# Patient Record
Sex: Female | Born: 1945 | Race: White | Hispanic: No | Smoking: Current every day smoker
Health system: Southern US, Community
[De-identification: ages and names within clinical notes are randomized; demographics above are authoritative.]

## PROBLEM LIST (undated history)

## (undated) DIAGNOSIS — I441 Atrioventricular block, second degree: Secondary | ICD-10-CM

## (undated) DIAGNOSIS — I10 Essential (primary) hypertension: Secondary | ICD-10-CM

## (undated) DIAGNOSIS — Z950 Presence of cardiac pacemaker: Secondary | ICD-10-CM

## (undated) DIAGNOSIS — E119 Type 2 diabetes mellitus without complications: Secondary | ICD-10-CM

## (undated) DIAGNOSIS — T8859XA Other complications of anesthesia, initial encounter: Secondary | ICD-10-CM

## (undated) HISTORY — DX: Atrioventricular block, second degree: I44.1

## (undated) HISTORY — PX: CHOLECYSTECTOMY: SHX55

---

## 2011-12-08 DIAGNOSIS — I10 Essential (primary) hypertension: Secondary | ICD-10-CM | POA: Diagnosis not present

## 2011-12-08 DIAGNOSIS — E119 Type 2 diabetes mellitus without complications: Secondary | ICD-10-CM | POA: Diagnosis not present

## 2011-12-08 DIAGNOSIS — E782 Mixed hyperlipidemia: Secondary | ICD-10-CM | POA: Diagnosis not present

## 2012-04-06 DIAGNOSIS — I10 Essential (primary) hypertension: Secondary | ICD-10-CM | POA: Diagnosis not present

## 2012-04-06 DIAGNOSIS — E782 Mixed hyperlipidemia: Secondary | ICD-10-CM | POA: Diagnosis not present

## 2012-08-31 DIAGNOSIS — Z1331 Encounter for screening for depression: Secondary | ICD-10-CM | POA: Diagnosis not present

## 2012-08-31 DIAGNOSIS — E782 Mixed hyperlipidemia: Secondary | ICD-10-CM | POA: Diagnosis not present

## 2012-08-31 DIAGNOSIS — I10 Essential (primary) hypertension: Secondary | ICD-10-CM | POA: Diagnosis not present

## 2012-08-31 DIAGNOSIS — Z23 Encounter for immunization: Secondary | ICD-10-CM | POA: Diagnosis not present

## 2012-08-31 DIAGNOSIS — E119 Type 2 diabetes mellitus without complications: Secondary | ICD-10-CM | POA: Diagnosis not present

## 2012-12-27 DIAGNOSIS — E782 Mixed hyperlipidemia: Secondary | ICD-10-CM | POA: Diagnosis not present

## 2012-12-27 DIAGNOSIS — I10 Essential (primary) hypertension: Secondary | ICD-10-CM | POA: Diagnosis not present

## 2013-02-20 DIAGNOSIS — R079 Chest pain, unspecified: Secondary | ICD-10-CM | POA: Diagnosis not present

## 2013-02-20 DIAGNOSIS — E860 Dehydration: Secondary | ICD-10-CM | POA: Diagnosis not present

## 2013-02-20 DIAGNOSIS — E785 Hyperlipidemia, unspecified: Secondary | ICD-10-CM | POA: Diagnosis present

## 2013-02-20 DIAGNOSIS — K859 Acute pancreatitis without necrosis or infection, unspecified: Secondary | ICD-10-CM | POA: Diagnosis not present

## 2013-02-20 DIAGNOSIS — R0602 Shortness of breath: Secondary | ICD-10-CM | POA: Diagnosis not present

## 2013-02-20 DIAGNOSIS — I10 Essential (primary) hypertension: Secondary | ICD-10-CM | POA: Diagnosis present

## 2013-02-20 DIAGNOSIS — R6889 Other general symptoms and signs: Secondary | ICD-10-CM | POA: Diagnosis not present

## 2013-02-20 DIAGNOSIS — IMO0001 Reserved for inherently not codable concepts without codable children: Secondary | ICD-10-CM | POA: Diagnosis not present

## 2013-02-20 DIAGNOSIS — T466X5A Adverse effect of antihyperlipidemic and antiarteriosclerotic drugs, initial encounter: Secondary | ICD-10-CM | POA: Diagnosis not present

## 2013-02-20 DIAGNOSIS — E119 Type 2 diabetes mellitus without complications: Secondary | ICD-10-CM | POA: Diagnosis not present

## 2013-02-20 DIAGNOSIS — E872 Acidosis, unspecified: Secondary | ICD-10-CM | POA: Diagnosis present

## 2013-02-20 DIAGNOSIS — R110 Nausea: Secondary | ICD-10-CM | POA: Diagnosis not present

## 2013-02-20 DIAGNOSIS — N390 Urinary tract infection, site not specified: Secondary | ICD-10-CM | POA: Diagnosis not present

## 2013-02-20 DIAGNOSIS — K861 Other chronic pancreatitis: Secondary | ICD-10-CM | POA: Diagnosis not present

## 2013-02-20 DIAGNOSIS — F329 Major depressive disorder, single episode, unspecified: Secondary | ICD-10-CM | POA: Diagnosis present

## 2013-02-20 DIAGNOSIS — Z79899 Other long term (current) drug therapy: Secondary | ICD-10-CM | POA: Diagnosis not present

## 2013-02-20 DIAGNOSIS — T383X5A Adverse effect of insulin and oral hypoglycemic [antidiabetic] drugs, initial encounter: Secondary | ICD-10-CM | POA: Diagnosis not present

## 2013-02-20 DIAGNOSIS — Z87891 Personal history of nicotine dependence: Secondary | ICD-10-CM | POA: Diagnosis not present

## 2013-02-20 DIAGNOSIS — F411 Generalized anxiety disorder: Secondary | ICD-10-CM | POA: Diagnosis present

## 2013-02-20 DIAGNOSIS — Z7982 Long term (current) use of aspirin: Secondary | ICD-10-CM | POA: Diagnosis not present

## 2013-02-20 DIAGNOSIS — N179 Acute kidney failure, unspecified: Secondary | ICD-10-CM | POA: Diagnosis not present

## 2013-03-01 DIAGNOSIS — E872 Acidosis: Secondary | ICD-10-CM | POA: Diagnosis not present

## 2013-03-01 DIAGNOSIS — K859 Acute pancreatitis without necrosis or infection, unspecified: Secondary | ICD-10-CM | POA: Diagnosis not present

## 2013-03-09 DIAGNOSIS — E872 Acidosis: Secondary | ICD-10-CM | POA: Diagnosis not present

## 2013-03-27 DIAGNOSIS — E782 Mixed hyperlipidemia: Secondary | ICD-10-CM | POA: Diagnosis not present

## 2013-03-27 DIAGNOSIS — I10 Essential (primary) hypertension: Secondary | ICD-10-CM | POA: Diagnosis not present

## 2013-03-27 DIAGNOSIS — H60399 Other infective otitis externa, unspecified ear: Secondary | ICD-10-CM | POA: Diagnosis not present

## 2013-07-21 DIAGNOSIS — I10 Essential (primary) hypertension: Secondary | ICD-10-CM | POA: Diagnosis not present

## 2013-07-21 DIAGNOSIS — E782 Mixed hyperlipidemia: Secondary | ICD-10-CM | POA: Diagnosis not present

## 2013-07-28 DIAGNOSIS — E782 Mixed hyperlipidemia: Secondary | ICD-10-CM | POA: Diagnosis not present

## 2013-07-28 DIAGNOSIS — Z1389 Encounter for screening for other disorder: Secondary | ICD-10-CM | POA: Diagnosis not present

## 2013-07-28 DIAGNOSIS — I10 Essential (primary) hypertension: Secondary | ICD-10-CM | POA: Diagnosis not present

## 2013-07-31 DIAGNOSIS — I10 Essential (primary) hypertension: Secondary | ICD-10-CM | POA: Diagnosis not present

## 2013-07-31 DIAGNOSIS — H5200 Hypermetropia, unspecified eye: Secondary | ICD-10-CM | POA: Diagnosis not present

## 2013-07-31 DIAGNOSIS — H52229 Regular astigmatism, unspecified eye: Secondary | ICD-10-CM | POA: Diagnosis not present

## 2013-07-31 DIAGNOSIS — E119 Type 2 diabetes mellitus without complications: Secondary | ICD-10-CM | POA: Diagnosis not present

## 2013-08-23 DIAGNOSIS — Z23 Encounter for immunization: Secondary | ICD-10-CM | POA: Diagnosis not present

## 2014-01-01 DIAGNOSIS — I10 Essential (primary) hypertension: Secondary | ICD-10-CM | POA: Diagnosis not present

## 2014-01-01 DIAGNOSIS — E782 Mixed hyperlipidemia: Secondary | ICD-10-CM | POA: Diagnosis not present

## 2014-01-01 DIAGNOSIS — IMO0001 Reserved for inherently not codable concepts without codable children: Secondary | ICD-10-CM | POA: Diagnosis not present

## 2014-01-08 DIAGNOSIS — E782 Mixed hyperlipidemia: Secondary | ICD-10-CM | POA: Diagnosis not present

## 2014-01-08 DIAGNOSIS — Z1389 Encounter for screening for other disorder: Secondary | ICD-10-CM | POA: Diagnosis not present

## 2014-01-08 DIAGNOSIS — I10 Essential (primary) hypertension: Secondary | ICD-10-CM | POA: Diagnosis not present

## 2014-01-08 DIAGNOSIS — IMO0001 Reserved for inherently not codable concepts without codable children: Secondary | ICD-10-CM | POA: Diagnosis not present

## 2014-01-22 DIAGNOSIS — Z1389 Encounter for screening for other disorder: Secondary | ICD-10-CM | POA: Diagnosis not present

## 2014-01-22 DIAGNOSIS — Z1331 Encounter for screening for depression: Secondary | ICD-10-CM | POA: Diagnosis not present

## 2014-01-22 DIAGNOSIS — Z0000 Encounter for general adult medical examination without abnormal findings: Secondary | ICD-10-CM | POA: Diagnosis not present

## 2014-01-22 DIAGNOSIS — Z139 Encounter for screening, unspecified: Secondary | ICD-10-CM | POA: Diagnosis not present

## 2014-04-17 DIAGNOSIS — Z6837 Body mass index (BMI) 37.0-37.9, adult: Secondary | ICD-10-CM | POA: Diagnosis not present

## 2014-04-17 DIAGNOSIS — IMO0001 Reserved for inherently not codable concepts without codable children: Secondary | ICD-10-CM | POA: Diagnosis not present

## 2014-04-17 DIAGNOSIS — S8990XA Unspecified injury of unspecified lower leg, initial encounter: Secondary | ICD-10-CM | POA: Diagnosis not present

## 2014-04-17 DIAGNOSIS — S79919A Unspecified injury of unspecified hip, initial encounter: Secondary | ICD-10-CM | POA: Diagnosis not present

## 2014-04-17 DIAGNOSIS — S99919A Unspecified injury of unspecified ankle, initial encounter: Secondary | ICD-10-CM | POA: Diagnosis not present

## 2014-04-17 DIAGNOSIS — M79609 Pain in unspecified limb: Secondary | ICD-10-CM | POA: Diagnosis not present

## 2014-04-17 DIAGNOSIS — M25569 Pain in unspecified knee: Secondary | ICD-10-CM | POA: Diagnosis not present

## 2014-04-24 DIAGNOSIS — M25559 Pain in unspecified hip: Secondary | ICD-10-CM | POA: Diagnosis not present

## 2014-04-24 DIAGNOSIS — Z6837 Body mass index (BMI) 37.0-37.9, adult: Secondary | ICD-10-CM | POA: Diagnosis not present

## 2014-05-01 DIAGNOSIS — IMO0001 Reserved for inherently not codable concepts without codable children: Secondary | ICD-10-CM | POA: Diagnosis not present

## 2014-05-01 DIAGNOSIS — I10 Essential (primary) hypertension: Secondary | ICD-10-CM | POA: Diagnosis not present

## 2014-05-01 DIAGNOSIS — E782 Mixed hyperlipidemia: Secondary | ICD-10-CM | POA: Diagnosis not present

## 2014-05-08 DIAGNOSIS — G4700 Insomnia, unspecified: Secondary | ICD-10-CM | POA: Diagnosis not present

## 2014-05-08 DIAGNOSIS — IMO0001 Reserved for inherently not codable concepts without codable children: Secondary | ICD-10-CM | POA: Diagnosis not present

## 2014-05-08 DIAGNOSIS — I10 Essential (primary) hypertension: Secondary | ICD-10-CM | POA: Diagnosis not present

## 2014-05-08 DIAGNOSIS — E782 Mixed hyperlipidemia: Secondary | ICD-10-CM | POA: Diagnosis not present

## 2014-06-20 DIAGNOSIS — L259 Unspecified contact dermatitis, unspecified cause: Secondary | ICD-10-CM | POA: Diagnosis not present

## 2014-06-20 DIAGNOSIS — Z6837 Body mass index (BMI) 37.0-37.9, adult: Secondary | ICD-10-CM | POA: Diagnosis not present

## 2014-08-07 DIAGNOSIS — E782 Mixed hyperlipidemia: Secondary | ICD-10-CM | POA: Diagnosis not present

## 2014-08-07 DIAGNOSIS — I10 Essential (primary) hypertension: Secondary | ICD-10-CM | POA: Diagnosis not present

## 2014-08-07 DIAGNOSIS — IMO0001 Reserved for inherently not codable concepts without codable children: Secondary | ICD-10-CM | POA: Diagnosis not present

## 2014-08-29 DIAGNOSIS — Z23 Encounter for immunization: Secondary | ICD-10-CM | POA: Diagnosis not present

## 2014-08-29 DIAGNOSIS — E1169 Type 2 diabetes mellitus with other specified complication: Secondary | ICD-10-CM | POA: Diagnosis not present

## 2014-08-29 DIAGNOSIS — E0869 Diabetes mellitus due to underlying condition with other specified complication: Secondary | ICD-10-CM | POA: Diagnosis not present

## 2014-08-29 DIAGNOSIS — I10 Essential (primary) hypertension: Secondary | ICD-10-CM | POA: Diagnosis not present

## 2014-08-29 DIAGNOSIS — E1165 Type 2 diabetes mellitus with hyperglycemia: Secondary | ICD-10-CM | POA: Diagnosis not present

## 2014-12-06 DIAGNOSIS — E1165 Type 2 diabetes mellitus with hyperglycemia: Secondary | ICD-10-CM | POA: Diagnosis not present

## 2014-12-06 DIAGNOSIS — E1169 Type 2 diabetes mellitus with other specified complication: Secondary | ICD-10-CM | POA: Diagnosis not present

## 2014-12-06 DIAGNOSIS — E0869 Diabetes mellitus due to underlying condition with other specified complication: Secondary | ICD-10-CM | POA: Diagnosis not present

## 2015-04-18 DIAGNOSIS — E119 Type 2 diabetes mellitus without complications: Secondary | ICD-10-CM | POA: Diagnosis not present

## 2015-04-18 DIAGNOSIS — H35373 Puckering of macula, bilateral: Secondary | ICD-10-CM | POA: Diagnosis not present

## 2015-04-18 DIAGNOSIS — H524 Presbyopia: Secondary | ICD-10-CM | POA: Diagnosis not present

## 2015-04-18 DIAGNOSIS — H5203 Hypermetropia, bilateral: Secondary | ICD-10-CM | POA: Diagnosis not present

## 2015-04-18 DIAGNOSIS — H43313 Vitreous membranes and strands, bilateral: Secondary | ICD-10-CM | POA: Diagnosis not present

## 2015-04-18 DIAGNOSIS — H25813 Combined forms of age-related cataract, bilateral: Secondary | ICD-10-CM | POA: Diagnosis not present

## 2015-04-18 DIAGNOSIS — H35351 Cystoid macular degeneration, right eye: Secondary | ICD-10-CM | POA: Diagnosis not present

## 2015-04-18 DIAGNOSIS — H52223 Regular astigmatism, bilateral: Secondary | ICD-10-CM | POA: Diagnosis not present

## 2015-04-18 DIAGNOSIS — H43813 Vitreous degeneration, bilateral: Secondary | ICD-10-CM | POA: Diagnosis not present

## 2015-04-18 DIAGNOSIS — I10 Essential (primary) hypertension: Secondary | ICD-10-CM | POA: Diagnosis not present

## 2015-05-10 DIAGNOSIS — E119 Type 2 diabetes mellitus without complications: Secondary | ICD-10-CM | POA: Diagnosis not present

## 2015-05-10 DIAGNOSIS — I10 Essential (primary) hypertension: Secondary | ICD-10-CM | POA: Diagnosis not present

## 2015-05-10 DIAGNOSIS — R05 Cough: Secondary | ICD-10-CM | POA: Diagnosis not present

## 2015-05-10 DIAGNOSIS — Z79899 Other long term (current) drug therapy: Secondary | ICD-10-CM | POA: Diagnosis not present

## 2015-05-10 DIAGNOSIS — J209 Acute bronchitis, unspecified: Secondary | ICD-10-CM | POA: Diagnosis not present

## 2015-05-10 DIAGNOSIS — H6691 Otitis media, unspecified, right ear: Secondary | ICD-10-CM | POA: Diagnosis not present

## 2015-05-10 DIAGNOSIS — Z7982 Long term (current) use of aspirin: Secondary | ICD-10-CM | POA: Diagnosis not present

## 2015-05-10 DIAGNOSIS — R509 Fever, unspecified: Secondary | ICD-10-CM | POA: Diagnosis not present

## 2015-05-10 DIAGNOSIS — Z87891 Personal history of nicotine dependence: Secondary | ICD-10-CM | POA: Diagnosis not present

## 2015-05-13 DIAGNOSIS — J40 Bronchitis, not specified as acute or chronic: Secondary | ICD-10-CM | POA: Diagnosis not present

## 2015-05-13 DIAGNOSIS — Z6837 Body mass index (BMI) 37.0-37.9, adult: Secondary | ICD-10-CM | POA: Diagnosis not present

## 2015-07-03 DIAGNOSIS — E1369 Other specified diabetes mellitus with other specified complication: Secondary | ICD-10-CM | POA: Diagnosis not present

## 2015-07-03 DIAGNOSIS — E1159 Type 2 diabetes mellitus with other circulatory complications: Secondary | ICD-10-CM | POA: Diagnosis not present

## 2015-07-03 DIAGNOSIS — E1165 Type 2 diabetes mellitus with hyperglycemia: Secondary | ICD-10-CM | POA: Diagnosis not present

## 2015-07-10 DIAGNOSIS — E1159 Type 2 diabetes mellitus with other circulatory complications: Secondary | ICD-10-CM | POA: Diagnosis not present

## 2015-07-10 DIAGNOSIS — E1369 Other specified diabetes mellitus with other specified complication: Secondary | ICD-10-CM | POA: Diagnosis not present

## 2015-07-10 DIAGNOSIS — E1165 Type 2 diabetes mellitus with hyperglycemia: Secondary | ICD-10-CM | POA: Diagnosis not present

## 2015-07-10 DIAGNOSIS — E785 Hyperlipidemia, unspecified: Secondary | ICD-10-CM | POA: Diagnosis not present

## 2015-09-30 DIAGNOSIS — Z23 Encounter for immunization: Secondary | ICD-10-CM | POA: Diagnosis not present

## 2015-10-08 DIAGNOSIS — E1369 Other specified diabetes mellitus with other specified complication: Secondary | ICD-10-CM | POA: Diagnosis not present

## 2015-10-08 DIAGNOSIS — E1165 Type 2 diabetes mellitus with hyperglycemia: Secondary | ICD-10-CM | POA: Diagnosis not present

## 2015-10-14 DIAGNOSIS — L0109 Other impetigo: Secondary | ICD-10-CM | POA: Diagnosis not present

## 2015-10-14 DIAGNOSIS — E785 Hyperlipidemia, unspecified: Secondary | ICD-10-CM | POA: Diagnosis not present

## 2015-10-14 DIAGNOSIS — E1165 Type 2 diabetes mellitus with hyperglycemia: Secondary | ICD-10-CM | POA: Diagnosis not present

## 2015-10-14 DIAGNOSIS — I10 Essential (primary) hypertension: Secondary | ICD-10-CM | POA: Diagnosis not present

## 2015-12-19 DIAGNOSIS — E113393 Type 2 diabetes mellitus with moderate nonproliferative diabetic retinopathy without macular edema, bilateral: Secondary | ICD-10-CM | POA: Diagnosis not present

## 2015-12-25 DIAGNOSIS — E1159 Type 2 diabetes mellitus with other circulatory complications: Secondary | ICD-10-CM | POA: Diagnosis not present

## 2015-12-25 DIAGNOSIS — E1165 Type 2 diabetes mellitus with hyperglycemia: Secondary | ICD-10-CM | POA: Diagnosis not present

## 2015-12-25 DIAGNOSIS — E1369 Other specified diabetes mellitus with other specified complication: Secondary | ICD-10-CM | POA: Diagnosis not present

## 2016-01-01 DIAGNOSIS — E1159 Type 2 diabetes mellitus with other circulatory complications: Secondary | ICD-10-CM | POA: Diagnosis not present

## 2016-01-01 DIAGNOSIS — E1165 Type 2 diabetes mellitus with hyperglycemia: Secondary | ICD-10-CM | POA: Diagnosis not present

## 2016-01-01 DIAGNOSIS — E785 Hyperlipidemia, unspecified: Secondary | ICD-10-CM | POA: Diagnosis not present

## 2016-01-01 DIAGNOSIS — I10 Essential (primary) hypertension: Secondary | ICD-10-CM | POA: Diagnosis not present

## 2016-01-15 DIAGNOSIS — Z139 Encounter for screening, unspecified: Secondary | ICD-10-CM | POA: Diagnosis not present

## 2016-01-15 DIAGNOSIS — Z1389 Encounter for screening for other disorder: Secondary | ICD-10-CM | POA: Diagnosis not present

## 2016-01-15 DIAGNOSIS — E1165 Type 2 diabetes mellitus with hyperglycemia: Secondary | ICD-10-CM | POA: Diagnosis not present

## 2016-05-07 DIAGNOSIS — E785 Hyperlipidemia, unspecified: Secondary | ICD-10-CM | POA: Diagnosis not present

## 2016-05-07 DIAGNOSIS — E1159 Type 2 diabetes mellitus with other circulatory complications: Secondary | ICD-10-CM | POA: Diagnosis not present

## 2016-05-07 DIAGNOSIS — E1165 Type 2 diabetes mellitus with hyperglycemia: Secondary | ICD-10-CM | POA: Diagnosis not present

## 2016-05-12 DIAGNOSIS — E1165 Type 2 diabetes mellitus with hyperglycemia: Secondary | ICD-10-CM | POA: Diagnosis not present

## 2016-05-12 DIAGNOSIS — E1159 Type 2 diabetes mellitus with other circulatory complications: Secondary | ICD-10-CM | POA: Diagnosis not present

## 2016-05-12 DIAGNOSIS — Z7189 Other specified counseling: Secondary | ICD-10-CM | POA: Diagnosis not present

## 2016-05-12 DIAGNOSIS — E785 Hyperlipidemia, unspecified: Secondary | ICD-10-CM | POA: Diagnosis not present

## 2016-05-12 DIAGNOSIS — I10 Essential (primary) hypertension: Secondary | ICD-10-CM | POA: Diagnosis not present

## 2016-07-07 DIAGNOSIS — H40033 Anatomical narrow angle, bilateral: Secondary | ICD-10-CM | POA: Diagnosis not present

## 2016-07-07 DIAGNOSIS — Z01818 Encounter for other preprocedural examination: Secondary | ICD-10-CM | POA: Diagnosis not present

## 2016-07-07 DIAGNOSIS — H25812 Combined forms of age-related cataract, left eye: Secondary | ICD-10-CM | POA: Diagnosis not present

## 2016-07-07 DIAGNOSIS — E113293 Type 2 diabetes mellitus with mild nonproliferative diabetic retinopathy without macular edema, bilateral: Secondary | ICD-10-CM | POA: Diagnosis not present

## 2016-07-23 DIAGNOSIS — E1159 Type 2 diabetes mellitus with other circulatory complications: Secondary | ICD-10-CM | POA: Diagnosis not present

## 2016-07-23 DIAGNOSIS — E785 Hyperlipidemia, unspecified: Secondary | ICD-10-CM | POA: Diagnosis not present

## 2016-07-23 DIAGNOSIS — E1165 Type 2 diabetes mellitus with hyperglycemia: Secondary | ICD-10-CM | POA: Diagnosis not present

## 2016-07-30 DIAGNOSIS — E1159 Type 2 diabetes mellitus with other circulatory complications: Secondary | ICD-10-CM | POA: Diagnosis not present

## 2016-07-30 DIAGNOSIS — E785 Hyperlipidemia, unspecified: Secondary | ICD-10-CM | POA: Diagnosis not present

## 2016-07-30 DIAGNOSIS — I10 Essential (primary) hypertension: Secondary | ICD-10-CM | POA: Diagnosis not present

## 2016-07-30 DIAGNOSIS — E1165 Type 2 diabetes mellitus with hyperglycemia: Secondary | ICD-10-CM | POA: Diagnosis not present

## 2016-08-11 DIAGNOSIS — E119 Type 2 diabetes mellitus without complications: Secondary | ICD-10-CM | POA: Diagnosis not present

## 2016-08-14 DIAGNOSIS — Z23 Encounter for immunization: Secondary | ICD-10-CM | POA: Diagnosis not present

## 2016-08-17 DIAGNOSIS — H25811 Combined forms of age-related cataract, right eye: Secondary | ICD-10-CM | POA: Diagnosis not present

## 2016-08-17 DIAGNOSIS — H52223 Regular astigmatism, bilateral: Secondary | ICD-10-CM | POA: Diagnosis not present

## 2016-08-17 DIAGNOSIS — H25812 Combined forms of age-related cataract, left eye: Secondary | ICD-10-CM | POA: Diagnosis not present

## 2016-08-17 DIAGNOSIS — H524 Presbyopia: Secondary | ICD-10-CM | POA: Diagnosis not present

## 2016-08-17 DIAGNOSIS — H5203 Hypermetropia, bilateral: Secondary | ICD-10-CM | POA: Diagnosis not present

## 2016-08-17 DIAGNOSIS — H2512 Age-related nuclear cataract, left eye: Secondary | ICD-10-CM | POA: Diagnosis not present

## 2016-08-27 DIAGNOSIS — I10 Essential (primary) hypertension: Secondary | ICD-10-CM | POA: Diagnosis not present

## 2016-08-27 DIAGNOSIS — Z961 Presence of intraocular lens: Secondary | ICD-10-CM | POA: Diagnosis not present

## 2016-08-27 DIAGNOSIS — H2511 Age-related nuclear cataract, right eye: Secondary | ICD-10-CM | POA: Diagnosis not present

## 2016-08-27 DIAGNOSIS — H25811 Combined forms of age-related cataract, right eye: Secondary | ICD-10-CM | POA: Diagnosis not present

## 2016-08-27 DIAGNOSIS — H25812 Combined forms of age-related cataract, left eye: Secondary | ICD-10-CM | POA: Diagnosis not present

## 2016-08-27 DIAGNOSIS — H40013 Open angle with borderline findings, low risk, bilateral: Secondary | ICD-10-CM | POA: Diagnosis not present

## 2016-09-28 DIAGNOSIS — E119 Type 2 diabetes mellitus without complications: Secondary | ICD-10-CM | POA: Diagnosis not present

## 2016-09-28 DIAGNOSIS — R509 Fever, unspecified: Secondary | ICD-10-CM | POA: Diagnosis not present

## 2016-09-28 DIAGNOSIS — R938 Abnormal findings on diagnostic imaging of other specified body structures: Secondary | ICD-10-CM | POA: Diagnosis not present

## 2016-09-28 DIAGNOSIS — R55 Syncope and collapse: Secondary | ICD-10-CM | POA: Diagnosis not present

## 2016-09-28 DIAGNOSIS — Z794 Long term (current) use of insulin: Secondary | ICD-10-CM | POA: Diagnosis not present

## 2016-09-28 DIAGNOSIS — B9689 Other specified bacterial agents as the cause of diseases classified elsewhere: Secondary | ICD-10-CM | POA: Diagnosis not present

## 2016-09-28 DIAGNOSIS — E871 Hypo-osmolality and hyponatremia: Secondary | ICD-10-CM | POA: Diagnosis not present

## 2016-09-28 DIAGNOSIS — I10 Essential (primary) hypertension: Secondary | ICD-10-CM | POA: Diagnosis not present

## 2016-09-28 DIAGNOSIS — E876 Hypokalemia: Secondary | ICD-10-CM | POA: Diagnosis not present

## 2016-09-28 DIAGNOSIS — N390 Urinary tract infection, site not specified: Secondary | ICD-10-CM | POA: Diagnosis not present

## 2016-09-28 DIAGNOSIS — Z79899 Other long term (current) drug therapy: Secondary | ICD-10-CM | POA: Diagnosis not present

## 2016-09-28 DIAGNOSIS — R51 Headache: Secondary | ICD-10-CM | POA: Diagnosis not present

## 2016-09-28 DIAGNOSIS — J069 Acute upper respiratory infection, unspecified: Secondary | ICD-10-CM | POA: Diagnosis not present

## 2016-09-28 DIAGNOSIS — R5081 Fever presenting with conditions classified elsewhere: Secondary | ICD-10-CM | POA: Diagnosis not present

## 2016-09-28 DIAGNOSIS — M1990 Unspecified osteoarthritis, unspecified site: Secondary | ICD-10-CM | POA: Diagnosis not present

## 2016-09-28 DIAGNOSIS — J320 Chronic maxillary sinusitis: Secondary | ICD-10-CM | POA: Diagnosis not present

## 2016-09-29 DIAGNOSIS — R5081 Fever presenting with conditions classified elsewhere: Secondary | ICD-10-CM | POA: Diagnosis not present

## 2017-03-02 DIAGNOSIS — E1159 Type 2 diabetes mellitus with other circulatory complications: Secondary | ICD-10-CM | POA: Diagnosis not present

## 2017-03-02 DIAGNOSIS — E1165 Type 2 diabetes mellitus with hyperglycemia: Secondary | ICD-10-CM | POA: Diagnosis not present

## 2017-03-02 DIAGNOSIS — E785 Hyperlipidemia, unspecified: Secondary | ICD-10-CM | POA: Diagnosis not present

## 2017-03-09 DIAGNOSIS — I10 Essential (primary) hypertension: Secondary | ICD-10-CM | POA: Diagnosis not present

## 2017-03-09 DIAGNOSIS — E785 Hyperlipidemia, unspecified: Secondary | ICD-10-CM | POA: Diagnosis not present

## 2017-03-09 DIAGNOSIS — E1159 Type 2 diabetes mellitus with other circulatory complications: Secondary | ICD-10-CM | POA: Diagnosis not present

## 2017-03-09 DIAGNOSIS — E1369 Other specified diabetes mellitus with other specified complication: Secondary | ICD-10-CM | POA: Diagnosis not present

## 2017-04-07 DIAGNOSIS — Z789 Other specified health status: Secondary | ICD-10-CM | POA: Diagnosis not present

## 2017-06-21 DIAGNOSIS — E1159 Type 2 diabetes mellitus with other circulatory complications: Secondary | ICD-10-CM | POA: Diagnosis not present

## 2017-06-21 DIAGNOSIS — Z789 Other specified health status: Secondary | ICD-10-CM | POA: Diagnosis not present

## 2017-06-21 DIAGNOSIS — N3941 Urge incontinence: Secondary | ICD-10-CM | POA: Diagnosis not present

## 2017-06-21 DIAGNOSIS — I10 Essential (primary) hypertension: Secondary | ICD-10-CM | POA: Diagnosis not present

## 2017-11-19 DIAGNOSIS — E1159 Type 2 diabetes mellitus with other circulatory complications: Secondary | ICD-10-CM | POA: Diagnosis not present

## 2017-11-19 DIAGNOSIS — E1165 Type 2 diabetes mellitus with hyperglycemia: Secondary | ICD-10-CM | POA: Diagnosis not present

## 2017-11-19 DIAGNOSIS — E785 Hyperlipidemia, unspecified: Secondary | ICD-10-CM | POA: Diagnosis not present

## 2017-12-27 DIAGNOSIS — E1369 Other specified diabetes mellitus with other specified complication: Secondary | ICD-10-CM | POA: Diagnosis not present

## 2017-12-27 DIAGNOSIS — Z139 Encounter for screening, unspecified: Secondary | ICD-10-CM | POA: Diagnosis not present

## 2017-12-27 DIAGNOSIS — Z9181 History of falling: Secondary | ICD-10-CM | POA: Diagnosis not present

## 2017-12-27 DIAGNOSIS — E1165 Type 2 diabetes mellitus with hyperglycemia: Secondary | ICD-10-CM | POA: Diagnosis not present

## 2017-12-27 DIAGNOSIS — Z1331 Encounter for screening for depression: Secondary | ICD-10-CM | POA: Diagnosis not present

## 2017-12-27 DIAGNOSIS — Z0000 Encounter for general adult medical examination without abnormal findings: Secondary | ICD-10-CM | POA: Diagnosis not present

## 2017-12-27 DIAGNOSIS — I10 Essential (primary) hypertension: Secondary | ICD-10-CM | POA: Diagnosis not present

## 2017-12-27 DIAGNOSIS — E1159 Type 2 diabetes mellitus with other circulatory complications: Secondary | ICD-10-CM | POA: Diagnosis not present

## 2018-01-14 DIAGNOSIS — I10 Essential (primary) hypertension: Secondary | ICD-10-CM | POA: Diagnosis not present

## 2018-01-14 DIAGNOSIS — E1159 Type 2 diabetes mellitus with other circulatory complications: Secondary | ICD-10-CM | POA: Diagnosis not present

## 2018-01-14 DIAGNOSIS — E1165 Type 2 diabetes mellitus with hyperglycemia: Secondary | ICD-10-CM | POA: Diagnosis not present

## 2018-01-14 DIAGNOSIS — Z789 Other specified health status: Secondary | ICD-10-CM | POA: Diagnosis not present

## 2018-03-17 DIAGNOSIS — E785 Hyperlipidemia, unspecified: Secondary | ICD-10-CM | POA: Diagnosis not present

## 2018-03-17 DIAGNOSIS — E1165 Type 2 diabetes mellitus with hyperglycemia: Secondary | ICD-10-CM | POA: Diagnosis not present

## 2018-03-17 DIAGNOSIS — E1159 Type 2 diabetes mellitus with other circulatory complications: Secondary | ICD-10-CM | POA: Diagnosis not present

## 2018-03-25 DIAGNOSIS — E1165 Type 2 diabetes mellitus with hyperglycemia: Secondary | ICD-10-CM | POA: Diagnosis not present

## 2018-03-25 DIAGNOSIS — I10 Essential (primary) hypertension: Secondary | ICD-10-CM | POA: Diagnosis not present

## 2018-03-25 DIAGNOSIS — E1159 Type 2 diabetes mellitus with other circulatory complications: Secondary | ICD-10-CM | POA: Diagnosis not present

## 2018-03-25 DIAGNOSIS — E785 Hyperlipidemia, unspecified: Secondary | ICD-10-CM | POA: Diagnosis not present

## 2018-05-22 DIAGNOSIS — I10 Essential (primary) hypertension: Secondary | ICD-10-CM | POA: Diagnosis not present

## 2018-05-22 DIAGNOSIS — E1165 Type 2 diabetes mellitus with hyperglycemia: Secondary | ICD-10-CM | POA: Diagnosis not present

## 2018-05-22 DIAGNOSIS — E1159 Type 2 diabetes mellitus with other circulatory complications: Secondary | ICD-10-CM | POA: Diagnosis not present

## 2018-05-22 DIAGNOSIS — E785 Hyperlipidemia, unspecified: Secondary | ICD-10-CM | POA: Diagnosis not present

## 2018-06-22 DIAGNOSIS — E1165 Type 2 diabetes mellitus with hyperglycemia: Secondary | ICD-10-CM | POA: Diagnosis not present

## 2018-06-22 DIAGNOSIS — E1159 Type 2 diabetes mellitus with other circulatory complications: Secondary | ICD-10-CM | POA: Diagnosis not present

## 2018-06-22 DIAGNOSIS — I10 Essential (primary) hypertension: Secondary | ICD-10-CM | POA: Diagnosis not present

## 2018-06-22 DIAGNOSIS — E785 Hyperlipidemia, unspecified: Secondary | ICD-10-CM | POA: Diagnosis not present

## 2018-06-24 DIAGNOSIS — E1159 Type 2 diabetes mellitus with other circulatory complications: Secondary | ICD-10-CM | POA: Diagnosis not present

## 2018-06-24 DIAGNOSIS — E1165 Type 2 diabetes mellitus with hyperglycemia: Secondary | ICD-10-CM | POA: Diagnosis not present

## 2018-06-24 DIAGNOSIS — E785 Hyperlipidemia, unspecified: Secondary | ICD-10-CM | POA: Diagnosis not present

## 2018-07-01 DIAGNOSIS — E1165 Type 2 diabetes mellitus with hyperglycemia: Secondary | ICD-10-CM | POA: Diagnosis not present

## 2018-07-01 DIAGNOSIS — E1369 Other specified diabetes mellitus with other specified complication: Secondary | ICD-10-CM | POA: Diagnosis not present

## 2018-07-01 DIAGNOSIS — E1159 Type 2 diabetes mellitus with other circulatory complications: Secondary | ICD-10-CM | POA: Diagnosis not present

## 2018-07-01 DIAGNOSIS — I10 Essential (primary) hypertension: Secondary | ICD-10-CM | POA: Diagnosis not present

## 2018-07-22 DIAGNOSIS — E1165 Type 2 diabetes mellitus with hyperglycemia: Secondary | ICD-10-CM | POA: Diagnosis not present

## 2018-07-22 DIAGNOSIS — I10 Essential (primary) hypertension: Secondary | ICD-10-CM | POA: Diagnosis not present

## 2018-07-22 DIAGNOSIS — E1369 Other specified diabetes mellitus with other specified complication: Secondary | ICD-10-CM | POA: Diagnosis not present

## 2018-07-22 DIAGNOSIS — E1159 Type 2 diabetes mellitus with other circulatory complications: Secondary | ICD-10-CM | POA: Diagnosis not present

## 2018-08-12 DIAGNOSIS — Z23 Encounter for immunization: Secondary | ICD-10-CM | POA: Diagnosis not present

## 2018-08-22 DIAGNOSIS — E1165 Type 2 diabetes mellitus with hyperglycemia: Secondary | ICD-10-CM | POA: Diagnosis not present

## 2018-08-22 DIAGNOSIS — I10 Essential (primary) hypertension: Secondary | ICD-10-CM | POA: Diagnosis not present

## 2018-08-22 DIAGNOSIS — E1369 Other specified diabetes mellitus with other specified complication: Secondary | ICD-10-CM | POA: Diagnosis not present

## 2018-08-22 DIAGNOSIS — E1159 Type 2 diabetes mellitus with other circulatory complications: Secondary | ICD-10-CM | POA: Diagnosis not present

## 2018-10-24 DIAGNOSIS — E785 Hyperlipidemia, unspecified: Secondary | ICD-10-CM | POA: Diagnosis not present

## 2018-10-24 DIAGNOSIS — E1159 Type 2 diabetes mellitus with other circulatory complications: Secondary | ICD-10-CM | POA: Diagnosis not present

## 2018-10-24 DIAGNOSIS — E1165 Type 2 diabetes mellitus with hyperglycemia: Secondary | ICD-10-CM | POA: Diagnosis not present

## 2018-11-03 DIAGNOSIS — E1151 Type 2 diabetes mellitus with diabetic peripheral angiopathy without gangrene: Secondary | ICD-10-CM | POA: Diagnosis not present

## 2018-11-03 DIAGNOSIS — E1159 Type 2 diabetes mellitus with other circulatory complications: Secondary | ICD-10-CM | POA: Diagnosis not present

## 2018-11-03 DIAGNOSIS — E1169 Type 2 diabetes mellitus with other specified complication: Secondary | ICD-10-CM | POA: Diagnosis not present

## 2018-11-03 DIAGNOSIS — Z139 Encounter for screening, unspecified: Secondary | ICD-10-CM | POA: Diagnosis not present

## 2018-11-03 DIAGNOSIS — I10 Essential (primary) hypertension: Secondary | ICD-10-CM | POA: Diagnosis not present

## 2019-01-26 DIAGNOSIS — E1169 Type 2 diabetes mellitus with other specified complication: Secondary | ICD-10-CM | POA: Diagnosis not present

## 2019-01-26 DIAGNOSIS — E1159 Type 2 diabetes mellitus with other circulatory complications: Secondary | ICD-10-CM | POA: Diagnosis not present

## 2019-02-03 DIAGNOSIS — E1159 Type 2 diabetes mellitus with other circulatory complications: Secondary | ICD-10-CM | POA: Diagnosis not present

## 2019-02-10 DIAGNOSIS — I10 Essential (primary) hypertension: Secondary | ICD-10-CM | POA: Diagnosis not present

## 2019-02-10 DIAGNOSIS — E782 Mixed hyperlipidemia: Secondary | ICD-10-CM | POA: Diagnosis not present

## 2019-02-10 DIAGNOSIS — E1169 Type 2 diabetes mellitus with other specified complication: Secondary | ICD-10-CM | POA: Diagnosis not present

## 2019-02-10 DIAGNOSIS — E1159 Type 2 diabetes mellitus with other circulatory complications: Secondary | ICD-10-CM | POA: Diagnosis not present

## 2019-05-29 DIAGNOSIS — E1169 Type 2 diabetes mellitus with other specified complication: Secondary | ICD-10-CM | POA: Diagnosis not present

## 2019-06-13 DIAGNOSIS — E113293 Type 2 diabetes mellitus with mild nonproliferative diabetic retinopathy without macular edema, bilateral: Secondary | ICD-10-CM | POA: Diagnosis not present

## 2019-06-13 DIAGNOSIS — H52223 Regular astigmatism, bilateral: Secondary | ICD-10-CM | POA: Diagnosis not present

## 2019-06-13 DIAGNOSIS — E119 Type 2 diabetes mellitus without complications: Secondary | ICD-10-CM | POA: Diagnosis not present

## 2019-06-13 DIAGNOSIS — H524 Presbyopia: Secondary | ICD-10-CM | POA: Diagnosis not present

## 2019-06-13 DIAGNOSIS — Z961 Presence of intraocular lens: Secondary | ICD-10-CM | POA: Diagnosis not present

## 2019-06-13 DIAGNOSIS — H40013 Open angle with borderline findings, low risk, bilateral: Secondary | ICD-10-CM | POA: Diagnosis not present

## 2019-06-13 DIAGNOSIS — H5203 Hypermetropia, bilateral: Secondary | ICD-10-CM | POA: Diagnosis not present

## 2019-06-13 DIAGNOSIS — H26493 Other secondary cataract, bilateral: Secondary | ICD-10-CM | POA: Diagnosis not present

## 2019-06-13 DIAGNOSIS — H40003 Preglaucoma, unspecified, bilateral: Secondary | ICD-10-CM | POA: Diagnosis not present

## 2019-06-13 DIAGNOSIS — H53021 Refractive amblyopia, right eye: Secondary | ICD-10-CM | POA: Diagnosis not present

## 2019-06-23 DIAGNOSIS — E1159 Type 2 diabetes mellitus with other circulatory complications: Secondary | ICD-10-CM | POA: Diagnosis not present

## 2019-06-23 DIAGNOSIS — Z0000 Encounter for general adult medical examination without abnormal findings: Secondary | ICD-10-CM | POA: Diagnosis not present

## 2019-06-23 DIAGNOSIS — Z139 Encounter for screening, unspecified: Secondary | ICD-10-CM | POA: Diagnosis not present

## 2019-06-23 DIAGNOSIS — E782 Mixed hyperlipidemia: Secondary | ICD-10-CM | POA: Diagnosis not present

## 2019-06-23 DIAGNOSIS — E1169 Type 2 diabetes mellitus with other specified complication: Secondary | ICD-10-CM | POA: Diagnosis not present

## 2019-06-23 DIAGNOSIS — E1151 Type 2 diabetes mellitus with diabetic peripheral angiopathy without gangrene: Secondary | ICD-10-CM | POA: Diagnosis not present

## 2019-09-13 DIAGNOSIS — H47233 Glaucomatous optic atrophy, bilateral: Secondary | ICD-10-CM | POA: Diagnosis not present

## 2019-09-13 DIAGNOSIS — H40013 Open angle with borderline findings, low risk, bilateral: Secondary | ICD-10-CM | POA: Diagnosis not present

## 2019-10-16 DIAGNOSIS — E1169 Type 2 diabetes mellitus with other specified complication: Secondary | ICD-10-CM | POA: Diagnosis not present

## 2019-10-23 DIAGNOSIS — E1169 Type 2 diabetes mellitus with other specified complication: Secondary | ICD-10-CM | POA: Diagnosis not present

## 2019-10-23 DIAGNOSIS — E782 Mixed hyperlipidemia: Secondary | ICD-10-CM | POA: Diagnosis not present

## 2019-10-23 DIAGNOSIS — N393 Stress incontinence (female) (male): Secondary | ICD-10-CM | POA: Diagnosis not present

## 2019-10-23 DIAGNOSIS — N1831 Chronic kidney disease, stage 3a: Secondary | ICD-10-CM | POA: Diagnosis not present

## 2019-10-23 DIAGNOSIS — Z23 Encounter for immunization: Secondary | ICD-10-CM | POA: Diagnosis not present

## 2019-12-21 DIAGNOSIS — N1831 Chronic kidney disease, stage 3a: Secondary | ICD-10-CM | POA: Diagnosis not present

## 2019-12-22 DIAGNOSIS — F4321 Adjustment disorder with depressed mood: Secondary | ICD-10-CM | POA: Diagnosis not present

## 2019-12-22 DIAGNOSIS — N1831 Chronic kidney disease, stage 3a: Secondary | ICD-10-CM | POA: Diagnosis not present

## 2019-12-22 DIAGNOSIS — R030 Elevated blood-pressure reading, without diagnosis of hypertension: Secondary | ICD-10-CM | POA: Diagnosis not present

## 2019-12-22 DIAGNOSIS — Z6838 Body mass index (BMI) 38.0-38.9, adult: Secondary | ICD-10-CM | POA: Diagnosis not present

## 2020-02-02 DIAGNOSIS — R002 Palpitations: Secondary | ICD-10-CM | POA: Diagnosis not present

## 2020-02-02 DIAGNOSIS — Z6838 Body mass index (BMI) 38.0-38.9, adult: Secondary | ICD-10-CM | POA: Diagnosis not present

## 2020-02-09 DIAGNOSIS — Z6838 Body mass index (BMI) 38.0-38.9, adult: Secondary | ICD-10-CM | POA: Diagnosis not present

## 2020-02-09 DIAGNOSIS — R002 Palpitations: Secondary | ICD-10-CM | POA: Diagnosis not present

## 2020-03-15 DIAGNOSIS — E1169 Type 2 diabetes mellitus with other specified complication: Secondary | ICD-10-CM | POA: Diagnosis not present

## 2020-03-22 DIAGNOSIS — E1159 Type 2 diabetes mellitus with other circulatory complications: Secondary | ICD-10-CM | POA: Diagnosis not present

## 2020-03-22 DIAGNOSIS — E782 Mixed hyperlipidemia: Secondary | ICD-10-CM | POA: Diagnosis not present

## 2020-03-22 DIAGNOSIS — E1169 Type 2 diabetes mellitus with other specified complication: Secondary | ICD-10-CM | POA: Diagnosis not present

## 2020-03-22 DIAGNOSIS — I152 Hypertension secondary to endocrine disorders: Secondary | ICD-10-CM | POA: Diagnosis not present

## 2020-05-14 DIAGNOSIS — Z23 Encounter for immunization: Secondary | ICD-10-CM | POA: Diagnosis not present

## 2020-05-27 ENCOUNTER — Inpatient Hospital Stay (HOSPITAL_COMMUNITY)
Admission: AD | Admit: 2020-05-27 | Discharge: 2020-05-29 | DRG: 244 | Disposition: A | Discharge status: Home / Self Care | Payer: Medicare Other | Source: Other Acute Inpatient Hospital | Attending: Internal Medicine | Admitting: Internal Medicine

## 2020-05-27 DIAGNOSIS — Z794 Long term (current) use of insulin: Secondary | ICD-10-CM

## 2020-05-27 DIAGNOSIS — Z20822 Contact with and (suspected) exposure to covid-19: Secondary | ICD-10-CM | POA: Diagnosis not present

## 2020-05-27 DIAGNOSIS — E119 Type 2 diabetes mellitus without complications: Secondary | ICD-10-CM | POA: Diagnosis not present

## 2020-05-27 DIAGNOSIS — I10 Essential (primary) hypertension: Secondary | ICD-10-CM | POA: Diagnosis not present

## 2020-05-27 DIAGNOSIS — I441 Atrioventricular block, second degree: Principal | ICD-10-CM | POA: Diagnosis present

## 2020-05-27 DIAGNOSIS — R55 Syncope and collapse: Secondary | ICD-10-CM | POA: Diagnosis not present

## 2020-05-27 DIAGNOSIS — I459 Conduction disorder, unspecified: Secondary | ICD-10-CM | POA: Diagnosis present

## 2020-05-27 DIAGNOSIS — Z959 Presence of cardiac and vascular implant and graft, unspecified: Secondary | ICD-10-CM

## 2020-05-27 MED ORDER — ACETAMINOPHEN 325 MG PO TABS
650.0000 mg | ORAL_TABLET | Freq: Four times a day (QID) | ORAL | Status: DC | PRN
  Administered 2020-05-27: 650 mg via ORAL
  Filled 2020-05-27: qty 2

## 2020-05-27 MED ORDER — INSULIN ASPART 100 UNIT/ML ~~LOC~~ SOLN
0.0000 [IU] | Freq: Three times a day (TID) | SUBCUTANEOUS | Status: DC
  Administered 2020-05-28: 3 [IU] via SUBCUTANEOUS
  Administered 2020-05-29: 5 [IU] via SUBCUTANEOUS

## 2020-05-27 MED ORDER — SODIUM CHLORIDE 0.9% FLUSH
3.0000 mL | Freq: Two times a day (BID) | INTRAVENOUS | Status: DC
  Administered 2020-05-27 – 2020-05-28 (×2): 3 mL via INTRAVENOUS

## 2020-05-28 ENCOUNTER — Other Ambulatory Visit: Payer: Self-pay

## 2020-05-28 ENCOUNTER — Inpatient Hospital Stay (HOSPITAL_COMMUNITY): Payer: Medicare Other

## 2020-05-28 ENCOUNTER — Encounter (HOSPITAL_COMMUNITY)
Admission: AD | Disposition: A | Discharge status: Home / Self Care | Payer: Self-pay | Source: Other Acute Inpatient Hospital | Attending: Internal Medicine

## 2020-05-28 DIAGNOSIS — I441 Atrioventricular block, second degree: Secondary | ICD-10-CM

## 2020-05-28 HISTORY — PX: PACEMAKER IMPLANT: EP1218

## 2020-05-28 LAB — COMPREHENSIVE METABOLIC PANEL
ALT: 16 U/L (ref 0–44)
AST: 21 U/L (ref 15–41)
Albumin: 3.8 g/dL (ref 3.5–5.0)
Alkaline Phosphatase: 44 U/L (ref 38–126)
Anion gap: 12 (ref 5–15)
BUN: 18 mg/dL (ref 8–23)
CO2: 27 mmol/L (ref 22–32)
Calcium: 9.3 mg/dL (ref 8.9–10.3)
Chloride: 100 mmol/L (ref 98–111)
Creatinine, Ser: 0.84 mg/dL (ref 0.44–1.00)
GFR calc Af Amer: >60 mL/min (ref >60)
GFR calc non Af Amer: >60 mL/min (ref >60)
Glucose, Bld: 120 mg/dL — ABNORMAL HIGH (ref 70–99)
Potassium: 4.6 mmol/L (ref 3.5–5.1)
Sodium: 139 mmol/L (ref 135–145)
Total Bilirubin: 0.9 mg/dL (ref 0.3–1.2)
Total Protein: 7.1 g/dL (ref 6.5–8.1)

## 2020-05-28 LAB — CBC
HCT: 40.7 % (ref 36.0–46.0)
Hemoglobin: 12.9 g/dL (ref 12.0–15.0)
MCH: 31.6 pg (ref 26.0–34.0)
MCHC: 31.7 g/dL (ref 30.0–36.0)
MCV: 99.8 fL (ref 80.0–100.0)
Platelets: 259 10*3/uL (ref 150–400)
RBC: 4.08 MIL/uL (ref 3.87–5.11)
RDW: 13.3 % (ref 11.5–15.5)
WBC: 9.7 10*3/uL (ref 4.0–10.5)
nRBC: 0 % (ref 0.0–0.2)

## 2020-05-28 LAB — SURGICAL PCR SCREEN
MRSA, PCR: NEGATIVE
Staphylococcus aureus: POSITIVE — AB

## 2020-05-28 LAB — GLUCOSE, CAPILLARY
Glucose-Capillary: 153 mg/dL — ABNORMAL HIGH (ref 70–99)
Glucose-Capillary: 123 mg/dL — ABNORMAL HIGH (ref 70–99)

## 2020-05-28 LAB — BRAIN NATRIURETIC PEPTIDE: B Natriuretic Peptide: 71.2 pg/mL (ref 0.0–100.0)

## 2020-05-28 LAB — ECHOCARDIOGRAM COMPLETE
Height: 63 in
Weight: 3360 oz

## 2020-05-28 LAB — HEMOGLOBIN A1C
Hgb A1c MFr Bld: 6.7 % — ABNORMAL HIGH (ref 4.8–5.6)
Mean Plasma Glucose: 145.59 mg/dL

## 2020-05-28 LAB — PROTIME-INR
INR: 1.1 (ref 0.8–1.2)
Prothrombin Time: 13.5 seconds (ref 11.4–15.2)

## 2020-05-28 LAB — TROPONIN I (HIGH SENSITIVITY)
Troponin I (High Sensitivity): 5 ng/L (ref <18)
Troponin I (High Sensitivity): 5 ng/L (ref <18)

## 2020-05-28 LAB — TSH: TSH: 2.224 u[IU]/mL (ref 0.350–4.500)

## 2020-05-28 SURGERY — PACEMAKER IMPLANT

## 2020-05-28 MED ORDER — LIDOCAINE HCL (PF) 1 % IJ SOLN
INTRAMUSCULAR | Status: AC
  Filled 2020-05-28: qty 30

## 2020-05-28 MED ORDER — CHLORHEXIDINE GLUCONATE 4 % EX LIQD: 60.0000 mL | Freq: Once | CUTANEOUS | Status: DC

## 2020-05-28 MED ORDER — MIDAZOLAM HCL 5 MG/5ML IJ SOLN
INTRAMUSCULAR | Status: AC
  Filled 2020-05-28: qty 5

## 2020-05-28 MED ORDER — SODIUM CHLORIDE 0.9 % IV SOLN: INTRAVENOUS | Status: DC

## 2020-05-28 MED ORDER — ATROPINE SULFATE 1 MG/10ML IJ SOSY: 0.5000 mg | PREFILLED_SYRINGE | INTRAMUSCULAR | Status: DC | PRN

## 2020-05-28 MED ORDER — SODIUM CHLORIDE 0.9% FLUSH: 3.0000 mL | INTRAVENOUS | Status: DC | PRN

## 2020-05-28 MED ORDER — FENTANYL CITRATE (PF) 100 MCG/2ML IJ SOLN
INTRAMUSCULAR | Status: DC | PRN
  Administered 2020-05-28 (×2): 25 ug via INTRAVENOUS

## 2020-05-28 MED ORDER — ONDANSETRON HCL 4 MG/2ML IJ SOLN: 4.0000 mg | Freq: Four times a day (QID) | INTRAMUSCULAR | Status: DC | PRN

## 2020-05-28 MED ORDER — CEFAZOLIN SODIUM-DEXTROSE 2-4 GM/100ML-% IV SOLN
2.0000 g | INTRAVENOUS | Status: AC
  Administered 2020-05-28: 2 g via INTRAVENOUS
  Filled 2020-05-28: qty 100

## 2020-05-28 MED ORDER — CEFAZOLIN SODIUM-DEXTROSE 1-4 GM/50ML-% IV SOLN
1.0000 g | Freq: Four times a day (QID) | INTRAVENOUS | Status: AC
  Administered 2020-05-28 – 2020-05-29 (×3): 1 g via INTRAVENOUS
  Filled 2020-05-28 (×3): qty 50

## 2020-05-28 MED ORDER — IOHEXOL 350 MG/ML SOLN
INTRAVENOUS | Status: DC | PRN
  Administered 2020-05-28: 15 mL

## 2020-05-28 MED ORDER — HEPARIN (PORCINE) IN NACL 1000-0.9 UT/500ML-% IV SOLN
INTRAVENOUS | Status: AC
  Filled 2020-05-28: qty 500

## 2020-05-28 MED ORDER — MUPIROCIN 2 % EX OINT
1.0000 "application " | TOPICAL_OINTMENT | Freq: Two times a day (BID) | CUTANEOUS | Status: DC
  Administered 2020-05-28 – 2020-05-29 (×4): 1 via NASAL
  Filled 2020-05-28: qty 22

## 2020-05-28 MED ORDER — ROPINIROLE HCL 1 MG PO TABS
1.0000 mg | ORAL_TABLET | Freq: Every day | ORAL | Status: DC
  Administered 2020-05-28: 1 mg via ORAL
  Filled 2020-05-28: qty 1

## 2020-05-28 MED ORDER — ACETAMINOPHEN 325 MG PO TABS: 325.0000 mg | ORAL_TABLET | ORAL | Status: DC | PRN

## 2020-05-28 MED ORDER — CEFAZOLIN SODIUM-DEXTROSE 2-4 GM/100ML-% IV SOLN
INTRAVENOUS | Status: AC
  Filled 2020-05-28: qty 100

## 2020-05-28 MED ORDER — LIDOCAINE HCL (PF) 1 % IJ SOLN
INTRAMUSCULAR | Status: DC | PRN
  Administered 2020-05-28: 80 mL
  Administered 2020-05-28: 60 mL

## 2020-05-28 MED ORDER — FENTANYL CITRATE (PF) 100 MCG/2ML IJ SOLN
INTRAMUSCULAR | Status: AC
  Filled 2020-05-28: qty 2

## 2020-05-28 MED ORDER — SODIUM CHLORIDE 0.9 % IV SOLN
80.0000 mg | INTRAVENOUS | Status: AC
  Administered 2020-05-28: 80 mg
  Filled 2020-05-28: qty 2

## 2020-05-28 MED ORDER — MIDAZOLAM HCL 5 MG/5ML IJ SOLN
INTRAMUSCULAR | Status: DC | PRN
  Administered 2020-05-28 (×3): 1 mg via INTRAVENOUS

## 2020-05-28 MED ORDER — HEPARIN (PORCINE) IN NACL 1000-0.9 UT/500ML-% IV SOLN
INTRAVENOUS | Status: DC | PRN
  Administered 2020-05-28: 500 mL

## 2020-05-28 MED ORDER — SODIUM CHLORIDE 0.9 % IV SOLN
250.0000 mL | INTRAVENOUS | Status: DC | PRN
  Administered 2020-05-29: 250 mL via INTRAVENOUS

## 2020-05-28 MED ORDER — SODIUM CHLORIDE 0.9 % IV SOLN
INTRAVENOUS | Status: AC
  Filled 2020-05-28: qty 2

## 2020-05-28 MED ORDER — HYDROCODONE-ACETAMINOPHEN 5-325 MG PO TABS
1.0000 | ORAL_TABLET | ORAL | Status: DC | PRN
  Administered 2020-05-28 – 2020-05-29 (×2): 2 via ORAL
  Filled 2020-05-28 (×2): qty 2

## 2020-05-28 MED ORDER — ISOPROTERENOL HCL 0.2 MG/ML IJ SOLN: 0.5000 ug/min | INTRAVENOUS | Status: DC

## 2020-05-28 MED ORDER — SODIUM CHLORIDE 0.9% FLUSH
3.0000 mL | Freq: Two times a day (BID) | INTRAVENOUS | Status: DC
  Administered 2020-05-28 – 2020-05-29 (×2): 3 mL via INTRAVENOUS

## 2020-05-28 SURGICAL SUPPLY — 7 items
CABLE SURGICAL S-101-97-12 (CABLE) ×3 IMPLANT
LEAD TENDRIL MRI 46CM LPA1200M (Lead) ×3 IMPLANT
LEAD TENDRIL MRI 58CM LPA1200M (Lead) ×3 IMPLANT
PACEMAKER ASSURITY DR-RF (Pacemaker) ×3 IMPLANT
PAD PRO RADIOLUCENT 2001M-C (PAD) ×3 IMPLANT
SHEATH 8FR PRELUDE SNAP 13 (SHEATH) ×6 IMPLANT
TRAY PACEMAKER INSERTION (PACKS) ×3 IMPLANT

## 2020-05-28 NOTE — Progress Notes (Signed)
  Echocardiogram 2D Echocardiogram has been performed.  Janalyn Harder 05/28/2020, 10:57 AM

## 2020-05-28 NOTE — Consult Note (Addendum)
ELECTROPHYSIOLOGY CONSULT NOTE    Patient ID: Jacqueline Fitzpatrick MRN: [id number], DOB/AGE: 75-19-1947 75 y.o.  Admit date: 05/27/2020 Date of Consult: 05/28/2020  Primary Physician: No primary care provider on file. Primary Cardiologist: No primary care provider on file.  Electrophysiologist: New   Referring Provider: Dr. Tomie China (transferred from [place])  Patient Profile: Jacqueline Fitzpatrick is a 74 y.o. female with a history of HTN and DM2 who is being seen today for the evaluation of presyncope, lightheadedness, and syncope for PPM consideration at the request of Dr. Tomie China  HPI:  Jacqueline Fitzpatrick is a 74 y.o. female with medical history of above. She has has about 10-15 syncopal episodes, some of which have been witnessed. Primarily occur while sitting, no associated trauma or falls. Symptoms last a few seconds and occur abruptly without any prodrome. Pt presented to [facility] and had 3 syncopal events in ED. One was noted to occur during high degree AV block with multiple dropped beats. Dr. Tomie China started her on isopril drip with stabilization of rhythm to NSR in 80-90s. Transferred to [facility] for PPM consideration. She is not on any AV nodal blocking agents. She has a distant history of palpitations for which she were holter monitor which was unremarkable.   Echocardiogram is pending.   She denies chest pain or palpitations. No recent illness  Surgical History:  Denies surgical history in area of pacemaker that would obstruct or otherwise interfere with placement.   No medications prior to admission.    Inpatient Medications:  . insulin aspart  0-15 Units Subcutaneous TID WC  . mupirocin ointment  1 application Nasal BID  . sodium chloride flush  3 mL Intravenous Q12H    Allergies: Reports allergy to metformin  Social History   Socioeconomic History  . Marital status: Widowed    Spouse name: Not on file  . Number of children: Not on file  . Years of education: Not  on file  . Highest education level: Not on file  Occupational History  . Not on file  Tobacco Use  . Smoking status: Not on file  Substance and Sexual Activity  . Alcohol use: Not on file  . Drug use: Not on file  . Sexual activity: Not on file  Other Topics Concern  . Not on file  Social History Narrative  . Not on file   Social Determinants of Health   Financial Resource Strain:   . Difficulty of Paying Living Expenses:   Food Insecurity:   . Worried About Programme researcher, broadcasting/film/video in the Last Year:   . Barista in the Last Year:   Transportation Needs:   . Freight forwarder (Medical):   Marland Kitchen Lack of Transportation (Non-Medical):   Physical Activity:   . Days of Exercise per Week:   . Minutes of Exercise per Session:   Stress:   . Feeling of Stress :   Social Connections:   . Frequency of Communication with Friends and Family:   . Frequency of Social Gatherings with Friends and Family:   . Attends Religious Services:   . Active Member of Clubs or Organizations:   . Attends Banker Meetings:   Marland Kitchen Marital Status:   Intimate Partner Violence:   . Fear of Current or Ex-Partner:   . Emotionally Abused:   Marland Kitchen Physically Abused:   . Sexually Abused:      No family history on file.   Review of Systems: All other systems reviewed and  are otherwise negative except as noted above.  Physical Exam: Vitals:   05/27/20 2157 05/28/20 0014 05/28/20 0329 05/28/20 0500  BP: 133/63 121/61 (!) 143/71   Pulse: (!) 101 88 80   Resp: 20 20 20    Temp: 98.7 F (37.1 C) 98.5 F (36.9 C) 98.1 F (36.7 C)   TempSrc: Oral Oral Oral   SpO2: 100% 94% 97%   Weight: 95.6 kg 95.3 kg  95.3 kg  Height: 5\' 3"  (1.6 m)       GEN- The patient is well appearing, alert and oriented x 3 today.   HEENT: normocephalic, atraumatic; sclera clear, conjunctiva pink; hearing intact; oropharynx clear; neck supple Lungs- Clear to ausculation bilaterally, normal work of breathing.  No  wheezes, rales, rhonchi Heart- Regular rate and rhythm, no murmurs, rubs or gallops GI- soft, non-tender, non-distended, bowel sounds present Extremities- no clubbing, cyanosis, or edema; DP/PT/radial pulses 2+ bilaterally MS- no significant deformity or atrophy Skin- warm and dry, no rash or lesion Psych- euthymic mood, full affect Neuro- strength and sensation are intact  Labs:   Lab Results  Component Value Date   WBC 9.7 05/28/2020   HGB 12.9 05/28/2020   HCT 40.7 05/28/2020   MCV 99.8 05/28/2020   PLT 259 05/28/2020    Recent Labs  Lab 05/28/20 0037  NA 139  K 4.6  CL 100  CO2 27  BUN 18  CREATININE 0.84  CALCIUM 9.3  PROT 7.1  BILITOT 0.9  ALKPHOS 44  ALT 16  AST 21  GLUCOSE 120*     Radiology/Studies: No results found.  EKG: NSR at 89 bpm, normal intervals (personally reviewed)  TELEMETRY: NSR 80-90s. PT had episode of intermittent 2:1 AV block this am at 0412 (personally reviewed)  Assessment/Plan: 1.  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  Explained risks, benefits, and alternatives to PPM implantation, including but not limited to bleeding, infection, pneumothorax, pericardial effusion, lead dislodgement, heart attack, stroke, or death.  Pt verbalized understanding and agrees to proceed if indicated.   2. DM2 Continue PTA medications  3. HTN Stable.   For questions or updates, please contact [facility] Please consult [email or web address] for contact info under Cardiology/STEMI.  Signed, 07/29/2020, PA-C  05/28/2020 6:53 AM   I have seen, examined the patient, and reviewed the above assessment and plan.  Changes to above are made where necessary.  On exam, RRR.  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  There are no reversible causes.  I would therefore recommend pacemaker implantation at this time.  Risks, benefits, alternatives to pacemaker implantation were discussed in detail with the patient today. The patient understands that the risks include but are not limited to bleeding, infection, pneumothorax, perforation, tamponade, vascular damage, renal failure, MI, stroke, death,  and lead dislodgement and wishes to proceed. We will therefore schedule the procedure at the next available time.  We also discussed remote monitoring and its role today.   Co Sign: Graciella Freer, MD 05/28/2020 2:20 PM

## 2020-05-28 NOTE — Interval H&P Note (Signed)
History and Physical Interval Note:  05/28/2020 2:21 PM  Jacqueline Fitzpatrick  has presented today for surgery, with the diagnosis of bradicardia.  The various methods of treatment have been discussed with the patient and family. After consideration of risks, benefits and other options for treatment, the patient has consented to  Procedure(s): PACEMAKER IMPLANT (N/A) as a surgical intervention.  The patient's history has been reviewed, patient examined, no change in status, stable for surgery.  I have reviewed the patient's chart and labs.  Questions were answered to the patient's satisfaction.     Hillis Range

## 2020-05-28 NOTE — Plan of Care (Signed)
  Problem: Education: Goal: Knowledge of General Education information will improve Description: Including pain rating scale, medication(s)/side effects and non-pharmacologic comfort measures Outcome: Progressing   Problem: Activity: Goal: Risk for activity intolerance will decrease Outcome: Progressing   Problem: Pain Managment: Goal: General experience of comfort will improve Outcome: Progressing   

## 2020-05-28 NOTE — H&P (View-Only) (Signed)
 ELECTROPHYSIOLOGY CONSULT NOTE    Patient ID: Jacqueline Fitzpatrick MRN: [id number], DOB/AGE: 75/13/1947 73 y.o.  Admit date: 05/27/2020 Date of Consult: 05/28/2020  Primary Physician: No primary care provider on file. Primary Cardiologist: No primary care provider on file.  Electrophysiologist: New   Referring Provider: Dr. Revankar (transferred from [place])  Patient Profile: Jacqueline Fitzpatrick is a 73 y.o. female with a history of HTN and DM2 who is being seen today for the evaluation of presyncope, lightheadedness, and syncope for PPM consideration at the request of Dr. Revankar  HPI:  Jacqueline Fitzpatrick is a 73 y.o. female with medical history of above. She has has about 10-15 syncopal episodes, some of which have been witnessed. Primarily occur while sitting, no associated trauma or falls. Symptoms last a few seconds and occur abruptly without any prodrome. Pt presented to [facility] and had 3 syncopal events in ED. One was noted to occur during high degree AV block with multiple dropped beats. Dr. Revankar started her on isopril drip with stabilization of rhythm to NSR in 80-90s. Transferred to [facility] for PPM consideration. She is not on any AV nodal blocking agents. She has a distant history of palpitations for which she were holter monitor which was unremarkable.   Echocardiogram is pending.   She denies chest pain or palpitations. No recent illness  Surgical History:  Denies surgical history in area of pacemaker that would obstruct or otherwise interfere with placement.   No medications prior to admission.    Inpatient Medications:  . insulin aspart  0-15 Units Subcutaneous TID WC  . mupirocin ointment  1 application Nasal BID  . sodium chloride flush  3 mL Intravenous Q12H    Allergies: Reports allergy to metformin  Social History   Socioeconomic History  . Marital status: Widowed    Spouse name: Not on file  . Number of children: Not on file  . Years of education: Not  on file  . Highest education level: Not on file  Occupational History  . Not on file  Tobacco Use  . Smoking status: Not on file  Substance and Sexual Activity  . Alcohol use: Not on file  . Drug use: Not on file  . Sexual activity: Not on file  Other Topics Concern  . Not on file  Social History Narrative  . Not on file   Social Determinants of Health   Financial Resource Strain:   . Difficulty of Paying Living Expenses:   Food Insecurity:   . Worried About Running Out of Food in the Last Year:   . Ran Out of Food in the Last Year:   Transportation Needs:   . Lack of Transportation (Medical):   . Lack of Transportation (Non-Medical):   Physical Activity:   . Days of Exercise per Week:   . Minutes of Exercise per Session:   Stress:   . Feeling of Stress :   Social Connections:   . Frequency of Communication with Friends and Family:   . Frequency of Social Gatherings with Friends and Family:   . Attends Religious Services:   . Active Member of Clubs or Organizations:   . Attends Club or Organization Meetings:   . Marital Status:   Intimate Partner Violence:   . Fear of Current or Ex-Partner:   . Emotionally Abused:   . Physically Abused:   . Sexually Abused:      No family history on file.   Review of Systems: All other systems reviewed and   are otherwise negative except as noted above.  Physical Exam: Vitals:   05/27/20 2157 05/28/20 0014 05/28/20 0329 05/28/20 0500  BP: 133/63 121/61 (!) 143/71   Pulse: (!) 101 88 80   Resp: 20 20 20    Temp: 98.7 F (37.1 C) 98.5 F (36.9 C) 98.1 F (36.7 C)   TempSrc: Oral Oral Oral   SpO2: 100% 94% 97%   Weight: 95.6 kg 95.3 kg  95.3 kg  Height: 5\' 3"  (1.6 m)       GEN- The patient is well appearing, alert and oriented x 3 today.   HEENT: normocephalic, atraumatic; sclera clear, conjunctiva pink; hearing intact; oropharynx clear; neck supple Lungs- Clear to ausculation bilaterally, normal work of breathing.  No  wheezes, rales, rhonchi Heart- Regular rate and rhythm, no murmurs, rubs or gallops GI- soft, non-tender, non-distended, bowel sounds present Extremities- no clubbing, cyanosis, or edema; DP/PT/radial pulses 2+ bilaterally MS- no significant deformity or atrophy Skin- warm and dry, no rash or lesion Psych- euthymic mood, full affect Neuro- strength and sensation are intact  Labs:   Lab Results  Component Value Date   WBC 9.7 05/28/2020   HGB 12.9 05/28/2020   HCT 40.7 05/28/2020   MCV 99.8 05/28/2020   PLT 259 05/28/2020    Recent Labs  Lab 05/28/20 0037  NA 139  K 4.6  CL 100  CO2 27  BUN 18  CREATININE 0.84  CALCIUM 9.3  PROT 7.1  BILITOT 0.9  ALKPHOS 44  ALT 16  AST 21  GLUCOSE 120*     Radiology/Studies: No results found.  EKG: NSR at 89 bpm, normal intervals (personally reviewed)  TELEMETRY: NSR 80-90s. PT had episode of intermittent 2:1 AV block this am at 0412 (personally reviewed)  Assessment/Plan: 1.  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  Explained risks, benefits, and alternatives to PPM implantation, including but not limited to bleeding, infection, pneumothorax, pericardial effusion, lead dislodgement, heart attack, stroke, or death.  Pt verbalized understanding and agrees to proceed if indicated.   2. DM2 Continue PTA medications  3. HTN Stable.   For questions or updates, please contact [facility] Please consult [email or web address] for contact info under Cardiology/STEMI.  Signed, 07/29/2020, PA-C  05/28/2020 6:53 AM   I have seen, examined the patient, and reviewed the above assessment and plan.  Changes to above are made where necessary.  On exam, RRR.  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  There are no reversible causes.  I would therefore recommend pacemaker implantation at this time.  Risks, benefits, alternatives to pacemaker implantation were discussed in detail with the patient today. The patient understands that the risks include but are not limited to bleeding, infection, pneumothorax, perforation, tamponade, vascular damage, renal failure, MI, stroke, death,  and lead dislodgement and wishes to proceed. We will therefore schedule the procedure at the next available time.  We also discussed remote monitoring and its role today.   Co Sign: Graciella Freer, MD 05/28/2020 2:20 PM

## 2020-05-28 NOTE — H&P (Signed)
CARDIOLOGY H&P  HPI: Patient is a 74 year old Caucasian female with past medical history of hypertension, diabetes who comes in with ongoing symptoms of presyncope, lightheadedness and syncope that have been occurring for the past 4 days.  She has had about 10-15 syncopal episode some of which have been witnessed.  Majority of the events occurred while the patient was sitting and was not associated with any trauma or falls.  Symptoms lasted a few seconds and occurred very abruptly without any significant warning signs. Given these recurrent syncopal events patient was taken to [facility] at which time she had 3 witnessed syncopal events. One of events was monitored while the patient was on telemetry and she was noted to have high degree AV block with dropped beats and symptoms with associated syncope.  She was evaluated by cardiologist Glean Hess Revankar) who placed her on an Isopril drip at 0.5 mics per minute after which improved her rhythm stabilized with ongoing sinus rhythm with heart rates in the 80s to 90s.  Thereafter they requested for transfer to an advanced center for pacemaker implantation.  Patient denies taking any SA/AV nodal blocking agents and denies any over-the-counter drug use.  She denies any prior cardiac history with the exception of palpitations which she encountered a few years back for which she had a Holter monitor which did not demonstrate any evidence of arrhythmias.  Otherwise she denies any episodes of chest pain, shortness of breath, fevers, chills, PND, orthopnea, lower extremity edema.  She denies any recent travel, tick bites.  Review of Systems:     Cardiac Review of Systems: {Y] = yes [ ]  = no  Chest Pain [    ]  Resting SOB [   ] Exertional SOB  [  ]  Orthopnea [  ]   Pedal Edema [   ]    Palpitations [ Y ] Syncope  [ Y ]   Presyncope [   ]  General Review of Systems: [Y] = yes [  ]=no Constitional: recent weight change [  ]; anorexia [  ]; fatigue [  ];  nausea [  ]; night sweats [  ]; fever [  ]; or chills [  ];                    Dental: poor dentition[  ];   Eye : blurred vision [  ]; diplopia [   ]; vision changes [  ];  Amaurosis fugax[  ]; Resp: cough [  ];  wheezing[  ];  hemoptysis[  ]; shortness of breath[  ]; paroxysmal nocturnal dyspnea[  ]; dyspnea on exertion[  ]; or orthopnea[  ];  GI:  gallstones[  ], vomiting[  ];  dysphagia[  ]; melena[  ];  hematochezia [  ]; heartburn[  ];   GU: kidney stones [  ]; hematuria[  ];   dysuria [  ];  nocturia[  ];               Skin: rash [  ], swelling[  ];, hair loss[  ];  peripheral edema[  ];  or itching[  ]; Musculosketetal: myalgias[  ];  joint swelling[  ];  joint erythema[  ];  joint pain[  ];  back pain[  ];  Heme/Lymph: bruising[  ];  bleeding[  ];  anemia[  ];  Neuro: TIA[  ];  headaches[  ];  stroke[  ];  vertigo[  ];  seizures[  ];  paresthesias[  ];  difficulty walking[  ];  Psych:depression[  ]; anxiety[  ];  Endocrine: diabetes[  ];  thyroid dysfunction[  ];  Other:  No past medical history on file.  Fluoxetine, glipizide 10 mg twice daily, Jardiance 10 mg daily, insulin glargine 10 units every morning, lisinopril 40 mg daily, ropinirole 1 mg twice daily, simvastatin 20 mg daily.  Not on File  Social History   Socioeconomic History  . Marital status: Widowed    Spouse name: Not on file  . Number of children: Not on file  . Years of education: Not on file  . Highest education level: Not on file  Occupational History  . Not on file  Tobacco Use  . Smoking status: Not on file  Substance and Sexual Activity  . Alcohol use: Not on file  . Drug use: Not on file  . Sexual activity: Not on file  Other Topics Concern  . Not on file  Social History Narrative  . Not on file   Social Determinants of Health   Financial Resource Strain:   . Difficulty of Paying Living Expenses:   Food Insecurity:   . Worried About Programme researcher, broadcasting/film/video in the Last Year:   . Engineer, site in the Last Year:   Transportation Needs:   . Freight forwarder (Medical):   Marland Kitchen Lack of Transportation (Non-Medical):   Physical Activity:   . Days of Exercise per Week:   . Minutes of Exercise per Session:   Stress:   . Feeling of Stress :   Social Connections:   . Frequency of Communication with Friends and Family:   . Frequency of Social Gatherings with Friends and Family:   . Attends Religious Services:   . Active Member of Clubs or Organizations:   . Attends Banker Meetings:   Marland Kitchen Marital Status:   Intimate Partner Violence:   . Fear of Current or Ex-Partner:   . Emotionally Abused:   Marland Kitchen Physically Abused:   . Sexually Abused:    No family history on file.  PHYSICAL EXAM: Vitals:   05/27/20 2157 05/28/20 0014  BP: 133/63 121/61  Pulse: (!) 101 88  Resp: 20 20  Temp: 98.7 F (37.1 C) 98.5 F (36.9 C)  SpO2: 100% 94%   General:  Well appearing. No respiratory difficulty.  Morbid obesity. HEENT: normal Neck: supple. no JVD. Carotids 2+ bilat; no bruits. No lymphadenopathy or thryomegaly appreciated. Cor: PMI nondisplaced. Regular rate & rhythm.  Systolic ejection murmur 3 out of 6. Lungs: clear Abdomen: soft, nontender, nondistended. No hepatosplenomegaly. No bruits or masses. Good bowel sounds. Extremities: no cyanosis, clubbing, rash, edema Neuro: alert & oriented x 3, cranial nerves grossly intact. moves all 4 extremities w/o difficulty. Affect pleasant.  ECG:  No results found for this or any previous visit (from the past 24 hour(s)). No results found.   ASSESSMENT:  High-grade AV block, symptomatic -Continue Isuprel at 0.5 mics per minute.  While on the Isopril heart rates remain in the 80s with sinus rhythm.  If needed will increase the dose further.  EKG from the outside hospital reviewed with sinus rhythm with heart rates in the 60s.  As needed atropine x1 dose. -Reversible medication causes reviewed without any evidence of AV nodal  blocking agents only one that may be questionable as the ropinirole.  TSH ordered and pending.  Troponins ordered and pending.  Initial troponin at the outside hospital was negative at 0.03. -  Patient n.p.o. after midnight for pacemaker implantation. -Echo for the a.m.  Patient does have systolic ejection murmur noted on exam. -Continue monitoring on telemetry. -INR at outside hospital of 1. -CT head at outside hospital negative for any pathological findings.  Type 2 diabetes -Currently on insulin sliding scale.  At home she takes insulin glargine at 10 units in the a.m. along with Jardiance 10 mg daily and glipizide 10 mg twice daily.  Given that she is n.p.o. after midnight we will just continue with the sliding scale for now. -A1c ordered and pending.  Lipid panel also pending.  Hypertension -On lisinopril therapy at 40 mg daily.  We will continue to monitor off antihypertensive therapy at this time.  Morbid obesity -We will benefit from outpatient sleep apnea testing.  Disposition: ICU and n.p.o. after midnight for pacemaker implantation.

## 2020-05-29 ENCOUNTER — Inpatient Hospital Stay (HOSPITAL_COMMUNITY): Payer: Medicare Other

## 2020-05-29 ENCOUNTER — Encounter (HOSPITAL_COMMUNITY): Payer: Self-pay | Admitting: Internal Medicine

## 2020-05-29 LAB — BASIC METABOLIC PANEL
Anion gap: 11 (ref 5–15)
BUN: 20 mg/dL (ref 8–23)
CO2: 24 mmol/L (ref 22–32)
Calcium: 8.8 mg/dL — ABNORMAL LOW (ref 8.9–10.3)
Chloride: 98 mmol/L (ref 98–111)
Creatinine, Ser: 0.9 mg/dL (ref 0.44–1.00)
GFR calc Af Amer: >60 mL/min (ref >60)
GFR calc non Af Amer: >60 mL/min (ref >60)
Glucose, Bld: 288 mg/dL — ABNORMAL HIGH (ref 70–99)
Potassium: 4.2 mmol/L (ref 3.5–5.1)
Sodium: 133 mmol/L — ABNORMAL LOW (ref 135–145)

## 2020-05-29 LAB — GLUCOSE, CAPILLARY
Glucose-Capillary: 201 mg/dL — ABNORMAL HIGH (ref 70–99)
Glucose-Capillary: 217 mg/dL — ABNORMAL HIGH (ref 70–99)

## 2020-05-29 MED ORDER — ACETAMINOPHEN 325 MG PO TABS: 325.0000 mg | ORAL_TABLET | ORAL | Status: DC | PRN

## 2020-05-29 MED ORDER — MUPIROCIN 2 % EX OINT: 1.0000 "application " | TOPICAL_OINTMENT | Freq: Two times a day (BID) | CUTANEOUS | 0 refills | Status: DC

## 2020-05-29 NOTE — Discharge Instructions (Signed)
After Your Pacemaker   . You have a St. Jude Pacemaker  ACTIVITY . Do not lift your arm above shoulder height for 1 week after your procedure. After 7 days, you may progress as below.     Wednesday June 05, 2020  Thursday June 06, 2020 Friday June 07, 2020 Saturday June 08, 2020   . Do not lift, push, pull, or carry anything over 10 pounds with the affected arm until 6 weeks (Wednesday July 10, 2020 ) after your procedure.   - Do NOT DRIVE for 6 weeks.   . Ask your healthcare provider when you can go back to work   INCISION/Dressing . If you are on a blood thinner such as Coumadin, Xarelto, Eliquis, Plavix, or Pradaxa please confirm with your provider when this should be resumed.   . Monitor your Pacemaker site for redness, swelling, and drainage. Call the device clinic at [phone] if you experience these symptoms or fever/chills.  . If your incision is sealed with Steri-strips or staples, you may shower 10 days after your procedure or when told by your provider. Do not remove the steri-strips or let the shower hit directly on your site. You may wash around your site with soap and water.    Marland Kitchen Avoid lotions, ointments, or perfumes over your incision until it is well-healed.  . You may use a hot tub or a pool AFTER your wound check appointment if the incision is completely closed.  . Pacemaker Alerts:  Some alerts are vibratory and others beep. These are NOT emergencies. Please call our office to let us know. If this occurs at night or on weekends, it can wait until the next business day. Send a remote transmission.  . If your device is capable of reading fluid status (for heart failure), you will be offered monthly monitoring to review this with you.   DEVICE MANAGEMENT . Remote monitoring is used to monitor your pacemaker from home. This monitoring is scheduled every 91 days by our office. It allows [place] to keep an eye on the functioning of your device to ensure it is working  properly. You will routinely see your Electrophysiologist annually (more often if necessary).   . You should receive your ID card for your new device in 4-8 weeks. Keep this card with you at all times once received. Consider wearing a medical alert bracelet or necklace.  . Your Pacemaker may be MRI compatible. This will be discussed at your next office visit/wound check.  You should avoid contact with strong electric or magnetic fields.    Do not use amateur (ham) radio equipment or electric (arc) welding torches. MP3 player headphones with magnets should not be used. Some devices are safe to use if held at least 12 inches (30 cm) from your Pacemaker. These include power tools, lawn mowers, and speakers. If you are unsure if something is safe to use, ask your health care provider.   When using your cell phone, hold it to the ear that is on the opposite side from the Pacemaker. Do not leave your cell phone in a pocket over the Pacemaker.   You may safely use electric blankets, heating pads, computers, and microwave ovens.  Call the office right away if:  You have chest pain.  You feel more short of breath than you have felt before.  You feel more light-headed than you have felt before.  Your incision starts to open up.  This information is not intended to replace advice  given to you by your health care provider. Make sure you discuss any questions you have with your health care provider.

## 2020-05-29 NOTE — Discharge Summary (Addendum)
ELECTROPHYSIOLOGY PROCEDURE DISCHARGE SUMMARY    Patient ID: Jacqueline Fitzpatrick,  MRN: [id number], DOB/AGE: [birth year] 75 y.o.  Admit date: 05/27/2020 Discharge date: 05/29/2020  Primary Care Physician: No primary care provider on file.  Electrophysiologist: Dr. 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 Metformin Anaphylaxis  . Anesthesia S-I-40 [Propofol] Other (See Comments)    Had trouble being woken up from anesthesia and then couldn't breathe when they did get her woken up.     Procedures This Admission:  1.  Implantation of a St. Jude dual chamber PPM on 05/28/2020 by Dr. Johney Frame.  The patient received a St. Jude model number [id number] PPM with model number [id number] right atrial lead and [id number] right ventricular lead. There were no immediate post procedure complications. 2.  CXR on 05/29/20 demonstrated no pneumothorax status post device implantation.   Brief HPI: Jacqueline Fitzpatrick is a 74 y.o. female was admitted for syncope and found to have symptomatic and intermitent mobitz II second degree AV block. Electrophysiology team asked to see for consideration of PPM implantation.  Past medical history includes above.  The patient has had symptomatic bradycardia without reversible causes identified.  Risks, benefits, and alternatives to PPM implantation were reviewed with the patient who wished to proceed.   Hospital Course:  The patient was admitted and underwent implantation of a St. Jude dual chamber PPM with details as outlined above.  She was monitored on telemetry overnight which demonstrated NSR in the 70s.  Left chest was without hematoma or ecchymosis.  The device was interrogated and found to be functioning normally.  CXR was obtained and demonstrated no pneumothorax status post device  implantation.  Wound care, arm mobility, and restrictions were reviewed with the patient.  The patient was examined and considered stable for discharge to home.    Pt not on anticoagulation.   Pt was instructed no to drive for six weeks given recent syncope.   Physical Exam: Vitals:   05/28/20 2127 05/29/20 0054 05/29/20 0628 05/29/20 0903  BP: 113/66 130/67 130/71 (!) 154/84  Pulse: 81 78 78 81  Resp: 17 17 17 20   Temp: 98.1 F (36.7 C) 97.9 F (36.6 C) 98 F (36.7 C) 97.6 F (36.4 C)  TempSrc: Oral Oral Oral Oral  SpO2: 93% 95% 92% 93%  Weight:  95.2 kg    Height:        GEN- The patient is well appearing, alert and oriented x 3 today.   HEENT: normocephalic, atraumatic; sclera clear, conjunctiva pink; hearing intact; oropharynx clear; neck supple, no JVP Lymph- no cervical lymphadenopathy Lungs- Clear to ausculation bilaterally, normal work of breathing.  No wheezes, rales, rhonchi Heart- Regular rate and rhythm, no murmurs, rubs or gallops, PMI not laterally displaced GI- soft, non-tender, non-distended, bowel sounds present, no hepatosplenomegaly Extremities- no clubbing, cyanosis, or edema; DP/PT/radial pulses 2+ bilaterally MS- no significant deformity or atrophy Skin- warm and dry, no rash or lesion, left chest without hematoma/ecchymosis Psych- euthymic mood, full affect Neuro- strength and sensation are intact   Labs:   Lab Results  Component Value Date   WBC 9.7 05/28/2020   HGB 12.9 05/28/2020   HCT 40.7 05/28/2020   MCV 99.8 05/28/2020   PLT 259 05/28/2020    Recent Labs  Lab 05/28/20 0037  NA 139  K 4.6  CL 100  CO2 27  BUN 18  CREATININE 0.84  CALCIUM 9.3  PROT 7.1  BILITOT 0.9  ALKPHOS 44  ALT 16  AST 21  GLUCOSE 120*    Discharge Medications:  Allergies as of 05/29/2020      Reactions   Metformin Anaphylaxis   Anesthesia S-i-40 [propofol] Other (See Comments)   Had trouble being woken up from anesthesia and then couldn't breathe when  they did get her woken up.      Medication List    TAKE these medications   acetaminophen 325 MG tablet Commonly known as: TYLENOL Take 1-2 tablets (325-650 mg total) by mouth every 4 (four) hours as needed for mild pain.   aspirin EC 81 MG tablet Take 81 mg by mouth daily. Swallow whole.   FISH OIL PO Take 1 capsule by mouth daily.   FLUoxetine 20 MG capsule Commonly known as: PROZAC Take 20 mg by mouth daily.   glipiZIDE 10 MG tablet Commonly known as: GLUCOTROL Take 10 mg by mouth 2 (two) times daily.   Jardiance 25 MG Tabs tablet Generic drug: empagliflozin Take 25 mg by mouth daily.   Lantus SoloStar 100 UNIT/ML Solostar Pen Generic drug: insulin glargine Inject 32 Units into the skin daily.   lisinopril 40 MG tablet Commonly known as: ZESTRIL Take 40 mg by mouth daily.   mupirocin ointment 2 % Commonly known as: BACTROBAN Place 1 application into the nose 2 (two) times daily.   rOPINIRole 1 MG tablet Commonly known as: REQUIP Take 1 mg by mouth 2 (two) times daily.   simvastatin 20 MG tablet Commonly known as: ZOCOR Take 20 mg by mouth daily.   traZODone 50 MG tablet Commonly known as: DESYREL Take 50 mg by mouth at bedtime as needed for sleep.       Disposition:    Follow-up Information    [facility] Follow up on 06/11/2020.   Why: at 1030 am for post pacemaker wound check Contact information: [address] [place] [id number] [phone]       Hillis Range, MD Follow up on 09/02/2020.   Specialty: Cardiology Why: at 330 pm for 3 months post pacemaker check Contact information: [address] [place] [address] [phone]               Duration of Discharge Encounter: Greater than 30 minutes including physician time.  Signed, Graciella Freer, PA-C  05/29/2020 9:15 AM  I have seen, examined the patient, and reviewed the  above assessment and plan.  Changes to above are made where necessary.  On exam, RRR.  Doing well s/p PPM  Device interrogation is reviewed and normal CXR reveals stable leads, no ptx  DC to home with routine wound care and follow-up  Co Sign: Hillis Range, MD 05/29/2020 5:20 PM

## 2020-06-03 DIAGNOSIS — Z7689 Persons encountering health services in other specified circumstances: Secondary | ICD-10-CM | POA: Diagnosis not present

## 2020-06-03 DIAGNOSIS — M1990 Unspecified osteoarthritis, unspecified site: Secondary | ICD-10-CM | POA: Diagnosis not present

## 2020-06-03 DIAGNOSIS — Z789 Other specified health status: Secondary | ICD-10-CM | POA: Diagnosis not present

## 2020-06-03 DIAGNOSIS — M8560 Other cyst of bone, unspecified site: Secondary | ICD-10-CM | POA: Diagnosis not present

## 2020-06-03 DIAGNOSIS — Z950 Presence of cardiac pacemaker: Secondary | ICD-10-CM | POA: Diagnosis not present

## 2020-06-07 LAB — DRUG PROFILE, UR, 9 DRUGS (LABCORP)
Amphetamines, Urine: NEGATIVE ng/mL
Barbiturate, Ur: NEGATIVE ng/mL
Benzodiazepine Quant, Ur: NEGATIVE ng/mL
Cannabinoid Quant, Ur: NEGATIVE ng/mL
Cocaine (Metab.): NEGATIVE ng/mL
Methadone Screen, Urine: NEGATIVE ng/mL
Opiate Quant, Ur: NEGATIVE ng/mL
Phencyclidine, Ur: NEGATIVE ng/mL
Propoxyphene, Urine: NEGATIVE ng/mL

## 2020-06-11 ENCOUNTER — Ambulatory Visit (INDEPENDENT_AMBULATORY_CARE_PROVIDER_SITE_OTHER): Payer: Medicare Other | Admitting: Emergency Medicine

## 2020-06-11 ENCOUNTER — Other Ambulatory Visit: Payer: Self-pay

## 2020-06-11 DIAGNOSIS — I459 Conduction disorder, unspecified: Secondary | ICD-10-CM

## 2020-06-13 DIAGNOSIS — Z23 Encounter for immunization: Secondary | ICD-10-CM | POA: Diagnosis not present

## 2020-06-13 LAB — CUP PACEART INCLINIC DEVICE CHECK
Battery Remaining Longevity: 138 mo
Battery Voltage: 3.14 V
Brady Statistic RA Percent Paced: 13 %
Brady Statistic RV Percent Paced: 0.18 %
Date Time Interrogation Session: 20210720104900
Implantable Lead Implant Date: 20210706
Implantable Lead Implant Date: 20210706
Implantable Lead Location: 753859
Implantable Lead Location: 753860
Implantable Pulse Generator Implant Date: 20210706
Lead Channel Impedance Value: 537.5 Ohm
Lead Channel Impedance Value: 625 Ohm
Lead Channel Pacing Threshold Amplitude: 0.5 V
Lead Channel Pacing Threshold Amplitude: 0.75 V
Lead Channel Pacing Threshold Pulse Width: 0.4 ms
Lead Channel Pacing Threshold Pulse Width: 0.4 ms
Lead Channel Sensing Intrinsic Amplitude: 0.9 mV
Lead Channel Sensing Intrinsic Amplitude: 5.6 mV
Lead Channel Setting Pacing Amplitude: 1 V
Lead Channel Setting Pacing Amplitude: 3.5 V
Lead Channel Setting Pacing Pulse Width: 0.4 ms
Lead Channel Setting Sensing Sensitivity: 2 mV
Pulse Gen Model: 2272
Pulse Gen Serial Number: 3847058

## 2020-06-13 NOTE — Progress Notes (Signed)
Wound check appointment. Steri-strips removed. Wound without redness or edema. Incision edges approximated, wound well healed. Normal device function. Thresholds, sensing, and impedances consistent with implant measurements. Device programmed at 3.5V/auto capture programmed on for extra safety margin until 3 month visit. Histogram distribution appropriate for patient and level of activity. No mode switches or high ventricular rates noted. Patient educated about wound care, arm mobility, lifting restrictions. ROV in 3 months with Dr Johney Frame on 09/02/20. Enrolled in remote follow up and next remote scheduled for 08/28/20.

## 2020-06-21 DIAGNOSIS — E1169 Type 2 diabetes mellitus with other specified complication: Secondary | ICD-10-CM | POA: Diagnosis not present

## 2020-06-23 DIAGNOSIS — I152 Hypertension secondary to endocrine disorders: Secondary | ICD-10-CM | POA: Diagnosis not present

## 2020-06-23 DIAGNOSIS — E782 Mixed hyperlipidemia: Secondary | ICD-10-CM | POA: Diagnosis not present

## 2020-06-23 DIAGNOSIS — E1169 Type 2 diabetes mellitus with other specified complication: Secondary | ICD-10-CM | POA: Diagnosis not present

## 2020-06-24 DIAGNOSIS — M19012 Primary osteoarthritis, left shoulder: Secondary | ICD-10-CM | POA: Diagnosis not present

## 2020-06-28 DIAGNOSIS — E782 Mixed hyperlipidemia: Secondary | ICD-10-CM | POA: Diagnosis not present

## 2020-06-28 DIAGNOSIS — Z136 Encounter for screening for cardiovascular disorders: Secondary | ICD-10-CM | POA: Diagnosis not present

## 2020-06-28 DIAGNOSIS — Z139 Encounter for screening, unspecified: Secondary | ICD-10-CM | POA: Diagnosis not present

## 2020-06-28 DIAGNOSIS — Z1331 Encounter for screening for depression: Secondary | ICD-10-CM | POA: Diagnosis not present

## 2020-06-28 DIAGNOSIS — Z6836 Body mass index (BMI) 36.0-36.9, adult: Secondary | ICD-10-CM | POA: Diagnosis not present

## 2020-06-28 DIAGNOSIS — E1159 Type 2 diabetes mellitus with other circulatory complications: Secondary | ICD-10-CM | POA: Diagnosis not present

## 2020-06-28 DIAGNOSIS — N1831 Chronic kidney disease, stage 3a: Secondary | ICD-10-CM | POA: Diagnosis not present

## 2020-06-28 DIAGNOSIS — Z1339 Encounter for screening examination for other mental health and behavioral disorders: Secondary | ICD-10-CM | POA: Diagnosis not present

## 2020-06-28 DIAGNOSIS — Z0000 Encounter for general adult medical examination without abnormal findings: Secondary | ICD-10-CM | POA: Diagnosis not present

## 2020-06-28 DIAGNOSIS — E1169 Type 2 diabetes mellitus with other specified complication: Secondary | ICD-10-CM | POA: Diagnosis not present

## 2020-06-28 DIAGNOSIS — E785 Hyperlipidemia, unspecified: Secondary | ICD-10-CM | POA: Diagnosis not present

## 2020-07-15 DIAGNOSIS — M19012 Primary osteoarthritis, left shoulder: Secondary | ICD-10-CM | POA: Diagnosis not present

## 2020-07-23 DIAGNOSIS — E1169 Type 2 diabetes mellitus with other specified complication: Secondary | ICD-10-CM | POA: Diagnosis not present

## 2020-07-23 DIAGNOSIS — E785 Hyperlipidemia, unspecified: Secondary | ICD-10-CM | POA: Diagnosis not present

## 2020-07-23 DIAGNOSIS — E782 Mixed hyperlipidemia: Secondary | ICD-10-CM | POA: Diagnosis not present

## 2020-08-05 DIAGNOSIS — Z6837 Body mass index (BMI) 37.0-37.9, adult: Secondary | ICD-10-CM | POA: Diagnosis not present

## 2020-08-05 DIAGNOSIS — M79641 Pain in right hand: Secondary | ICD-10-CM | POA: Diagnosis not present

## 2020-08-06 DIAGNOSIS — H524 Presbyopia: Secondary | ICD-10-CM | POA: Diagnosis not present

## 2020-08-06 DIAGNOSIS — M19041 Primary osteoarthritis, right hand: Secondary | ICD-10-CM | POA: Diagnosis not present

## 2020-08-06 DIAGNOSIS — Z961 Presence of intraocular lens: Secondary | ICD-10-CM | POA: Diagnosis not present

## 2020-08-06 DIAGNOSIS — H5203 Hypermetropia, bilateral: Secondary | ICD-10-CM | POA: Diagnosis not present

## 2020-08-06 DIAGNOSIS — H47233 Glaucomatous optic atrophy, bilateral: Secondary | ICD-10-CM | POA: Diagnosis not present

## 2020-08-06 DIAGNOSIS — E119 Type 2 diabetes mellitus without complications: Secondary | ICD-10-CM | POA: Diagnosis not present

## 2020-08-06 DIAGNOSIS — H52223 Regular astigmatism, bilateral: Secondary | ICD-10-CM | POA: Diagnosis not present

## 2020-08-06 DIAGNOSIS — H26491 Other secondary cataract, right eye: Secondary | ICD-10-CM | POA: Diagnosis not present

## 2020-08-06 DIAGNOSIS — H26492 Other secondary cataract, left eye: Secondary | ICD-10-CM | POA: Diagnosis not present

## 2020-08-06 DIAGNOSIS — M79641 Pain in right hand: Secondary | ICD-10-CM | POA: Diagnosis not present

## 2020-08-06 DIAGNOSIS — H40003 Preglaucoma, unspecified, bilateral: Secondary | ICD-10-CM | POA: Diagnosis not present

## 2020-08-12 DIAGNOSIS — Z6836 Body mass index (BMI) 36.0-36.9, adult: Secondary | ICD-10-CM | POA: Diagnosis not present

## 2020-08-12 DIAGNOSIS — M79641 Pain in right hand: Secondary | ICD-10-CM | POA: Diagnosis not present

## 2020-08-23 DIAGNOSIS — E785 Hyperlipidemia, unspecified: Secondary | ICD-10-CM | POA: Diagnosis not present

## 2020-08-23 DIAGNOSIS — E782 Mixed hyperlipidemia: Secondary | ICD-10-CM | POA: Diagnosis not present

## 2020-08-23 DIAGNOSIS — E1169 Type 2 diabetes mellitus with other specified complication: Secondary | ICD-10-CM | POA: Diagnosis not present

## 2020-08-28 ENCOUNTER — Ambulatory Visit (INDEPENDENT_AMBULATORY_CARE_PROVIDER_SITE_OTHER): Payer: Medicare Other

## 2020-08-28 DIAGNOSIS — I459 Conduction disorder, unspecified: Secondary | ICD-10-CM

## 2020-08-29 LAB — CUP PACEART REMOTE DEVICE CHECK
Battery Remaining Longevity: 113 mo
Battery Remaining Percentage: 95.5 %
Battery Voltage: 3.04 V
Brady Statistic AP VP Percent: 1 %
Brady Statistic AP VS Percent: 18 %
Brady Statistic AS VP Percent: 1 %
Brady Statistic AS VS Percent: 79 %
Brady Statistic RA Percent Paced: 17 %
Brady Statistic RV Percent Paced: 1.1 %
Date Time Interrogation Session: 20211006075626
Implantable Lead Implant Date: 20210706
Implantable Lead Implant Date: 20210706
Implantable Lead Location: 753859
Implantable Lead Location: 753860
Implantable Pulse Generator Implant Date: 20210706
Lead Channel Impedance Value: 530 Ohm
Lead Channel Impedance Value: 650 Ohm
Lead Channel Pacing Threshold Amplitude: 0.5 V
Lead Channel Pacing Threshold Amplitude: 0.875 V
Lead Channel Pacing Threshold Pulse Width: 0.4 ms
Lead Channel Pacing Threshold Pulse Width: 0.4 ms
Lead Channel Sensing Intrinsic Amplitude: 0.6 mV
Lead Channel Sensing Intrinsic Amplitude: 3 mV
Lead Channel Setting Pacing Amplitude: 1.125
Lead Channel Setting Pacing Amplitude: 3.5 V
Lead Channel Setting Pacing Pulse Width: 0.4 ms
Lead Channel Setting Sensing Sensitivity: 2 mV
Pulse Gen Model: 2272
Pulse Gen Serial Number: 3847058

## 2020-09-02 ENCOUNTER — Ambulatory Visit (INDEPENDENT_AMBULATORY_CARE_PROVIDER_SITE_OTHER): Payer: Medicare Other | Admitting: Internal Medicine

## 2020-09-02 ENCOUNTER — Encounter: Payer: Self-pay | Admitting: Internal Medicine

## 2020-09-02 ENCOUNTER — Other Ambulatory Visit: Payer: Self-pay

## 2020-09-02 VITALS — BP 108/64 | HR 90 | Ht 63.0 in | Wt 198.8 lb

## 2020-09-02 DIAGNOSIS — I10 Essential (primary) hypertension: Secondary | ICD-10-CM | POA: Diagnosis not present

## 2020-09-02 DIAGNOSIS — I459 Conduction disorder, unspecified: Secondary | ICD-10-CM | POA: Diagnosis not present

## 2020-09-02 NOTE — Progress Notes (Signed)
    PCP: Charlott Rakes, MD   Primary EP:  Dr Johney Frame  Jacqueline Fitzpatrick is a 74 y.o. female who presents today for routine electrophysiology followup.  Since her pacemaker implant the patient reports doing very well.  She has rare palpitations which correlate to nonsustained atach (lasting only several seconds).  no sustained symptoms.  Today, she denies symptoms of chest pain, shortness of breath,  lower extremity edema, dizziness, presyncope, or syncope.  The patient is otherwise without complaint today.   Past Medical History:  Diagnosis Date  . Second degree Mobitz II AV block    s/p PPM    ROS- all systems are reviewed and negative except as per HPI above  Current Outpatient Medications  Medication Sig Dispense Refill  . acetaminophen (TYLENOL) 325 MG tablet Take 1-2 tablets (325-650 mg total) by mouth every 4 (four) hours as needed for mild pain.    Marland Kitchen aspirin EC 81 MG tablet Take 81 mg by mouth daily. Swallow whole.    Marland Kitchen FLUoxetine (PROZAC) 20 MG capsule Take 20 mg by mouth daily.    Marland Kitchen glipiZIDE (GLUCOTROL) 10 MG tablet Take 10 mg by mouth 2 (two) times daily.    Marland Kitchen JARDIANCE 25 MG TABS tablet Take 25 mg by mouth daily.    Marland Kitchen LANTUS SOLOSTAR 100 UNIT/ML Solostar Pen Inject 32 Units into the skin daily.    Marland Kitchen lisinopril (ZESTRIL) 40 MG tablet Take 40 mg by mouth daily.    . mupirocin ointment (BACTROBAN) 2 % Place 1 application into the nose 2 (two) times daily. 22 g 0  . Omega-3 Fatty Acids (FISH OIL PO) Take 1 capsule by mouth daily.    Marland Kitchen rOPINIRole (REQUIP) 1 MG tablet Take 1 mg by mouth 2 (two) times daily.    . simvastatin (ZOCOR) 20 MG tablet Take 20 mg by mouth daily.    . traZODone (DESYREL) 50 MG tablet Take 50 mg by mouth at bedtime as needed for sleep.     No current facility-administered medications for this visit.    Physical Exam: Vitals:   09/02/20 1545  BP: 108/64  Pulse: 90  SpO2: 95%  Weight: 198 lb 12.8 oz (90.2 kg)  Height: 5\' 3"  (1.6 m)    GEN- The  patient is well appearing, alert and oriented x 3 today.   Head- normocephalic, atraumatic Eyes-  Sclera clear, conjunctiva pink Ears- hearing intact Oropharynx- clear Lungs-   normal work of breathing Chest- pacemaker pocket is well healed Heart- Regular rate and rhythm  GI- soft  Extremities- no clubbing, cyanosis, or edema  Pacemaker interrogation- reviewed in detail today,  See PACEART report  ekg tracing ordered today is personally reviewed and shows sinus rhythm  Assessment and Plan:  1. Symptomatic mobitz II second degree heart block Normal pacemaker function See Pace Art report No changes today she is not device dependant today  2. Syncope Resolved with PPM  3. HTN Stable No change required today  4. Nonsustained atach Noted No changes at this time  Return to see PA in a year  Risks, benefits and potential toxicities for medications prescribed and/or refilled reviewed with patient today.   MD, [facility] 09/02/2020 4:06 PM

## 2020-09-02 NOTE — Progress Notes (Signed)
Remote pacemaker transmission.   

## 2020-09-02 NOTE — Patient Instructions (Addendum)
Medication Instructions:  Your physician recommends that you continue on your current medications as directed. Please refer to the Current Medication list given to you today.  *If you need a refill on your cardiac medications before your next appointment, please call your pharmacy*  Lab Work: None ordered.  If you have labs (blood work) drawn today and your tests are completely normal, you will receive your results only by: Marland Kitchen MyChart Message (if you have MyChart) OR . A paper copy in the mail If you have any lab test that is abnormal or we need to change your treatment, we will call you to review the results.  Testing/Procedures: None ordered.  Follow-Up: At [facility], you and your health needs are our priority.  As part of our continuing mission to provide you with exceptional heart care, we have created designated Provider Care Teams.  These Care Teams include your primary Cardiologist (physician) and Advanced Practice Providers (APPs -  Physician Assistants and Nurse Practitioners) who all work together to provide you with the care you need, when you need it.  We recommend signing up for the patient portal called "MyChart".  Sign up information is provided on this After Visit Summary.  MyChart is used to connect with patients for Virtual Visits (Telemedicine).  Patients are able to view lab/test results, encounter notes, upcoming appointments, etc.  Non-urgent messages can be sent to your provider as well.   To learn more about what you can do with MyChart, go to [email or web address].    Your next appointment:   Your physician wants you to follow-up in: 1 year with Otilio Saber.  You will receive a reminder letter in the mail two months in advance. If you don't receive a letter, please call our office to schedule the follow-up appointment.  Remote monitoring is used to monitor your Pacemaker from home. This monitoring reduces the number of office visits required to check your  device to one time per year. It allows [place] to keep an eye on the functioning of your device to ensure it is working properly. You are scheduled for a device check from home on 11/27/20. You may send your transmission at any time that day. If you have a wireless device, the transmission will be sent automatically. After your physician reviews your transmission, you will receive a postcard with your next transmission date.  Other Instructions:

## 2020-09-03 LAB — CUP PACEART INCLINIC DEVICE CHECK
Battery Remaining Longevity: 140 mo
Battery Voltage: 3.04 V
Brady Statistic RA Percent Paced: 18 %
Brady Statistic RV Percent Paced: 1 %
Date Time Interrogation Session: 20211011154100
Implantable Lead Implant Date: 20210706
Implantable Lead Implant Date: 20210706
Implantable Lead Location: 753859
Implantable Lead Location: 753860
Implantable Pulse Generator Implant Date: 20210706
Lead Channel Impedance Value: 537.5 Ohm
Lead Channel Impedance Value: 650 Ohm
Lead Channel Pacing Threshold Amplitude: 0.5 V
Lead Channel Pacing Threshold Amplitude: 0.5 V
Lead Channel Pacing Threshold Amplitude: 0.75 V
Lead Channel Pacing Threshold Pulse Width: 0.4 ms
Lead Channel Pacing Threshold Pulse Width: 0.4 ms
Lead Channel Pacing Threshold Pulse Width: 0.4 ms
Lead Channel Sensing Intrinsic Amplitude: 4.2 mV
Lead Channel Sensing Intrinsic Amplitude: 5 mV
Lead Channel Setting Pacing Amplitude: 1 V
Lead Channel Setting Pacing Amplitude: 2 V
Lead Channel Setting Pacing Pulse Width: 0.4 ms
Lead Channel Setting Sensing Sensitivity: 2 mV
Pulse Gen Model: 2272
Pulse Gen Serial Number: 3847058

## 2020-09-09 DIAGNOSIS — M65331 Trigger finger, right middle finger: Secondary | ICD-10-CM | POA: Diagnosis not present

## 2020-09-09 DIAGNOSIS — Z6836 Body mass index (BMI) 36.0-36.9, adult: Secondary | ICD-10-CM | POA: Diagnosis not present

## 2020-09-09 DIAGNOSIS — Z23 Encounter for immunization: Secondary | ICD-10-CM | POA: Diagnosis not present

## 2020-09-23 DIAGNOSIS — E782 Mixed hyperlipidemia: Secondary | ICD-10-CM | POA: Diagnosis not present

## 2020-09-23 DIAGNOSIS — E785 Hyperlipidemia, unspecified: Secondary | ICD-10-CM | POA: Diagnosis not present

## 2020-09-23 DIAGNOSIS — E1169 Type 2 diabetes mellitus with other specified complication: Secondary | ICD-10-CM | POA: Diagnosis not present

## 2020-09-27 DIAGNOSIS — E1169 Type 2 diabetes mellitus with other specified complication: Secondary | ICD-10-CM | POA: Diagnosis not present

## 2020-09-30 DIAGNOSIS — M66241 Spontaneous rupture of extensor tendons, right hand: Secondary | ICD-10-CM | POA: Diagnosis not present

## 2020-09-30 DIAGNOSIS — M65331 Trigger finger, right middle finger: Secondary | ICD-10-CM | POA: Diagnosis not present

## 2020-09-30 DIAGNOSIS — M79641 Pain in right hand: Secondary | ICD-10-CM | POA: Diagnosis not present

## 2020-10-07 DIAGNOSIS — Z23 Encounter for immunization: Secondary | ICD-10-CM | POA: Diagnosis not present

## 2020-10-07 DIAGNOSIS — E1169 Type 2 diabetes mellitus with other specified complication: Secondary | ICD-10-CM | POA: Diagnosis not present

## 2020-10-07 DIAGNOSIS — I152 Hypertension secondary to endocrine disorders: Secondary | ICD-10-CM | POA: Diagnosis not present

## 2020-10-07 DIAGNOSIS — E782 Mixed hyperlipidemia: Secondary | ICD-10-CM | POA: Diagnosis not present

## 2020-10-07 DIAGNOSIS — E1159 Type 2 diabetes mellitus with other circulatory complications: Secondary | ICD-10-CM | POA: Diagnosis not present

## 2020-10-23 DIAGNOSIS — E1169 Type 2 diabetes mellitus with other specified complication: Secondary | ICD-10-CM | POA: Diagnosis not present

## 2020-10-23 DIAGNOSIS — E782 Mixed hyperlipidemia: Secondary | ICD-10-CM | POA: Diagnosis not present

## 2020-10-23 DIAGNOSIS — I152 Hypertension secondary to endocrine disorders: Secondary | ICD-10-CM | POA: Diagnosis not present

## 2020-10-23 DIAGNOSIS — E1159 Type 2 diabetes mellitus with other circulatory complications: Secondary | ICD-10-CM | POA: Diagnosis not present

## 2020-10-23 DIAGNOSIS — M66241 Spontaneous rupture of extensor tendons, right hand: Secondary | ICD-10-CM | POA: Diagnosis not present

## 2020-11-19 DIAGNOSIS — M25571 Pain in right ankle and joints of right foot: Secondary | ICD-10-CM | POA: Diagnosis not present

## 2020-11-19 DIAGNOSIS — M7989 Other specified soft tissue disorders: Secondary | ICD-10-CM | POA: Diagnosis not present

## 2020-11-19 DIAGNOSIS — Z6835 Body mass index (BMI) 35.0-35.9, adult: Secondary | ICD-10-CM | POA: Diagnosis not present

## 2020-11-20 DIAGNOSIS — G4489 Other headache syndrome: Secondary | ICD-10-CM | POA: Diagnosis not present

## 2020-11-20 DIAGNOSIS — R0602 Shortness of breath: Secondary | ICD-10-CM | POA: Diagnosis not present

## 2020-11-20 DIAGNOSIS — R531 Weakness: Secondary | ICD-10-CM | POA: Diagnosis not present

## 2020-11-20 DIAGNOSIS — R112 Nausea with vomiting, unspecified: Secondary | ICD-10-CM | POA: Diagnosis not present

## 2020-11-20 DIAGNOSIS — R069 Unspecified abnormalities of breathing: Secondary | ICD-10-CM | POA: Diagnosis not present

## 2020-11-20 DIAGNOSIS — E1165 Type 2 diabetes mellitus with hyperglycemia: Secondary | ICD-10-CM | POA: Diagnosis not present

## 2020-11-20 DIAGNOSIS — R000 Tachycardia, unspecified: Secondary | ICD-10-CM | POA: Diagnosis not present

## 2020-11-20 DIAGNOSIS — R002 Palpitations: Secondary | ICD-10-CM | POA: Diagnosis not present

## 2020-11-20 DIAGNOSIS — S82831A Other fracture of upper and lower end of right fibula, initial encounter for closed fracture: Secondary | ICD-10-CM | POA: Diagnosis not present

## 2020-11-27 ENCOUNTER — Ambulatory Visit (INDEPENDENT_AMBULATORY_CARE_PROVIDER_SITE_OTHER): Payer: Medicare Other

## 2020-11-27 ENCOUNTER — Telehealth: Payer: Self-pay | Admitting: *Deleted

## 2020-11-27 DIAGNOSIS — M66241 Spontaneous rupture of extensor tendons, right hand: Secondary | ICD-10-CM | POA: Diagnosis not present

## 2020-11-27 DIAGNOSIS — I459 Conduction disorder, unspecified: Secondary | ICD-10-CM | POA: Diagnosis not present

## 2020-11-27 LAB — CUP PACEART REMOTE DEVICE CHECK
Battery Remaining Longevity: 133 mo
Battery Remaining Percentage: 95.5 %
Battery Voltage: 3.04 V
Brady Statistic AP VP Percent: 1 %
Brady Statistic AP VS Percent: 10 %
Brady Statistic AS VP Percent: 1 %
Brady Statistic AS VS Percent: 89 %
Brady Statistic RA Percent Paced: 10 %
Brady Statistic RV Percent Paced: 1 %
Date Time Interrogation Session: 20220105054115
Implantable Lead Implant Date: 20210706
Implantable Lead Implant Date: 20210706
Implantable Lead Location: 753859
Implantable Lead Location: 753860
Implantable Pulse Generator Implant Date: 20210706
Lead Channel Impedance Value: 460 Ohm
Lead Channel Impedance Value: 530 Ohm
Lead Channel Pacing Threshold Amplitude: 0.5 V
Lead Channel Pacing Threshold Amplitude: 0.75 V
Lead Channel Pacing Threshold Pulse Width: 0.4 ms
Lead Channel Pacing Threshold Pulse Width: 0.4 ms
Lead Channel Sensing Intrinsic Amplitude: 1 mV
Lead Channel Sensing Intrinsic Amplitude: 4.3 mV
Lead Channel Setting Pacing Amplitude: 1 V
Lead Channel Setting Pacing Amplitude: 2 V
Lead Channel Setting Pacing Pulse Width: 0.4 ms
Lead Channel Setting Sensing Sensitivity: 2 mV
Pulse Gen Model: 2272
Pulse Gen Serial Number: 3847058

## 2020-11-27 NOTE — Telephone Encounter (Signed)
   [facility] Pre-operative Risk Assessment    HEARTCARE STAFF: - Please ensure there is not already an duplicate clearance open for this procedure. - Under Visit Info/Reason for Call, type in Other and utilize the format Clearance MM/DD/YY or Clearance TBD. Do not use dashes or single digits. - If request is for dental extraction, please clarify the # of teeth to be extracted.  Request for surgical clearance:  1. What type of surgery is being performed? RIGHT LONG FINGER EXTENSOR TENDON SUBLUXATION   2. When is this surgery scheduled? 12/12/20   3. What type of clearance is required (medical clearance vs. Pharmacy clearance to hold med vs. Both)? MEDICAL  4. Are there any medications that need to be held prior to surgery and how long? ASA    5. Practice name and name of physician performing surgery? [facility]; DR. Lennette Bihari KUZMA   6. What is the office phone number? [phone]    7.   What is the office fax number? [phone]  8.   Anesthesia type (None, local, MAC, general) ? CHOICE   Julaine Hua 11/27/2020, 4:27 PM  _________________________________________________________________   (provider comments below)

## 2020-11-28 ENCOUNTER — Other Ambulatory Visit: Payer: Self-pay | Admitting: Orthopedic Surgery

## 2020-11-28 NOTE — Telephone Encounter (Signed)
   Primary Cardiologist: Dr. Hillis Range, MD   Chart reviewed as part of pre-operative protocol coverage. Patient was contacted 11/28/2020 in reference to pre-operative risk assessment for pending surgery as outlined below.  Sayde Lish was last seen on 09/02/21 by Dr. Johney Frame.  At that time, Ms. Mungia was doing well from a CV standpoint. Her PPM was functioning properly with no recurrent symptoms. She was not pacemaker dependant at that time. She has no hx of CAD therefore ASA may be held prior to procedure.   Therefore, based on ACC/AHA guidelines, the patient would be at acceptable risk for the planned procedure without further cardiovascular testing.   The patient was advised that if she develops new symptoms prior to surgery to contact our office to arrange for a follow-up visit, and she verbalized understanding.  I will route this recommendation to the requesting party via Epic fax function and remove from pre-op pool. Please call with questions.  Georgie Chard, NP 11/28/2020, 8:32 AM

## 2020-12-02 DIAGNOSIS — S8264XA Nondisplaced fracture of lateral malleolus of right fibula, initial encounter for closed fracture: Secondary | ICD-10-CM | POA: Diagnosis not present

## 2020-12-05 ENCOUNTER — Encounter (HOSPITAL_BASED_OUTPATIENT_CLINIC_OR_DEPARTMENT_OTHER): Payer: Self-pay | Admitting: Orthopedic Surgery

## 2020-12-05 ENCOUNTER — Encounter: Payer: Self-pay | Admitting: Internal Medicine

## 2020-12-05 ENCOUNTER — Other Ambulatory Visit: Payer: Self-pay

## 2020-12-05 NOTE — Progress Notes (Unsigned)
PERIOPERATIVE PRESCRIPTION FOR IMPLANTED CARDIAC DEVICE PROGRAMMING  Patient Information: Name:  Jacqueline Fitzpatrick  DOB:  July 09, 1946  MRN:  [id number]  {TIP - You do not have to delete this tip  -  Copy the info from the staff message sent by the PAT staff  then press F2 here and paste the information using CTL - V on the next line :[id number]}  Planned Procedure:Right Long Finger Extensor Tendon Subluxation Repair-Right  Surgeon: Dr. Betha Loa  Date of Procedure: 12/12/20 at 1200  Cautery will be used.  Position during surgery: Supine    Please send back to Dawayne Patricia or [employer].   Device Information:  Clinic EP Physician:  Hillis Range, MD   Device Type:  Pacemaker Manufacturer and Phone #:  St. Jude/Abbott: [phone] Pacemaker Dependent?:  No. Date of Last Device Check:  09/02/2020 Normal Device Function?:  Yes.    Electrophysiologist's Recommendations:   Have magnet available.  Provide continuous ECG monitoring when magnet is used or reprogramming is to be performed.   Procedure may interfere with device function.  Magnet should be placed over device during procedure.  Per Device Clinic Standing Orders, Lenor Coffin, RN  1:36 PM 12/05/2020

## 2020-12-09 ENCOUNTER — Inpatient Hospital Stay (HOSPITAL_COMMUNITY): Admission: RE | Admit: 2020-12-09 | Payer: Medicare Other | Source: Ambulatory Visit

## 2020-12-10 ENCOUNTER — Other Ambulatory Visit (HOSPITAL_COMMUNITY)
Admission: RE | Admit: 2020-12-10 | Discharge: 2020-12-10 | Disposition: A | Discharge status: Home / Self Care | Payer: Medicare Other | Source: Ambulatory Visit | Attending: Orthopedic Surgery | Admitting: Orthopedic Surgery

## 2020-12-10 ENCOUNTER — Encounter (HOSPITAL_BASED_OUTPATIENT_CLINIC_OR_DEPARTMENT_OTHER)
Admission: RE | Admit: 2020-12-10 | Discharge: 2020-12-10 | Disposition: A | Discharge status: Home / Self Care | Payer: Medicare Other | Source: Ambulatory Visit | Attending: Orthopedic Surgery | Admitting: Orthopedic Surgery

## 2020-12-10 DIAGNOSIS — Z888 Allergy status to other drugs, medicaments and biological substances status: Secondary | ICD-10-CM | POA: Diagnosis not present

## 2020-12-10 DIAGNOSIS — Z20822 Contact with and (suspected) exposure to covid-19: Secondary | ICD-10-CM | POA: Insufficient documentation

## 2020-12-10 DIAGNOSIS — F17200 Nicotine dependence, unspecified, uncomplicated: Secondary | ICD-10-CM | POA: Diagnosis not present

## 2020-12-10 DIAGNOSIS — Z950 Presence of cardiac pacemaker: Secondary | ICD-10-CM | POA: Diagnosis not present

## 2020-12-10 DIAGNOSIS — S63612A Unspecified sprain of right middle finger, initial encounter: Secondary | ICD-10-CM | POA: Diagnosis present

## 2020-12-10 DIAGNOSIS — Z01818 Encounter for other preprocedural examination: Secondary | ICD-10-CM | POA: Insufficient documentation

## 2020-12-10 DIAGNOSIS — Y939 Activity, unspecified: Secondary | ICD-10-CM | POA: Diagnosis not present

## 2020-12-10 DIAGNOSIS — X58XXXA Exposure to other specified factors, initial encounter: Secondary | ICD-10-CM | POA: Diagnosis not present

## 2020-12-10 DIAGNOSIS — Z01812 Encounter for preprocedural laboratory examination: Secondary | ICD-10-CM | POA: Insufficient documentation

## 2020-12-10 DIAGNOSIS — E119 Type 2 diabetes mellitus without complications: Secondary | ICD-10-CM | POA: Diagnosis not present

## 2020-12-10 DIAGNOSIS — S63692A Other sprain of right middle finger, initial encounter: Secondary | ICD-10-CM | POA: Diagnosis not present

## 2020-12-10 DIAGNOSIS — Z884 Allergy status to anesthetic agent status: Secondary | ICD-10-CM | POA: Diagnosis not present

## 2020-12-10 LAB — BASIC METABOLIC PANEL
Anion gap: 12 (ref 5–15)
BUN: 22 mg/dL (ref 8–23)
CO2: 21 mmol/L — ABNORMAL LOW (ref 22–32)
Calcium: 9.4 mg/dL (ref 8.9–10.3)
Chloride: 103 mmol/L (ref 98–111)
Creatinine, Ser: 0.86 mg/dL (ref 0.44–1.00)
GFR, Estimated: >60 mL/min (ref >60)
Glucose, Bld: 231 mg/dL — ABNORMAL HIGH (ref 70–99)
Potassium: 4.5 mmol/L (ref 3.5–5.1)
Sodium: 136 mmol/L (ref 135–145)

## 2020-12-10 NOTE — Progress Notes (Signed)

## 2020-12-11 LAB — SARS CORONAVIRUS 2 (TAT 6-24 HRS): SARS Coronavirus 2: NEGATIVE

## 2020-12-11 NOTE — Progress Notes (Signed)
Remote pacemaker transmission.   

## 2020-12-12 ENCOUNTER — Other Ambulatory Visit: Payer: Self-pay

## 2020-12-12 ENCOUNTER — Encounter (HOSPITAL_BASED_OUTPATIENT_CLINIC_OR_DEPARTMENT_OTHER): Payer: Self-pay | Admitting: Orthopedic Surgery

## 2020-12-12 ENCOUNTER — Ambulatory Visit (HOSPITAL_BASED_OUTPATIENT_CLINIC_OR_DEPARTMENT_OTHER): Payer: Medicare Other | Admitting: Anesthesiology

## 2020-12-12 ENCOUNTER — Ambulatory Visit (HOSPITAL_BASED_OUTPATIENT_CLINIC_OR_DEPARTMENT_OTHER)
Admission: RE | Admit: 2020-12-12 | Discharge: 2020-12-12 | Disposition: A | Discharge status: Home / Self Care | Payer: Medicare Other | Attending: Orthopedic Surgery | Admitting: Orthopedic Surgery

## 2020-12-12 ENCOUNTER — Encounter (HOSPITAL_BASED_OUTPATIENT_CLINIC_OR_DEPARTMENT_OTHER)
Admission: RE | Disposition: A | Discharge status: Home / Self Care | Payer: Self-pay | Source: Home / Self Care | Attending: Orthopedic Surgery

## 2020-12-12 DIAGNOSIS — S63692A Other sprain of right middle finger, initial encounter: Secondary | ICD-10-CM | POA: Insufficient documentation

## 2020-12-12 DIAGNOSIS — E119 Type 2 diabetes mellitus without complications: Secondary | ICD-10-CM | POA: Insufficient documentation

## 2020-12-12 DIAGNOSIS — I10 Essential (primary) hypertension: Secondary | ICD-10-CM | POA: Diagnosis not present

## 2020-12-12 DIAGNOSIS — S63214A Subluxation of metacarpophalangeal joint of right ring finger, initial encounter: Secondary | ICD-10-CM | POA: Diagnosis not present

## 2020-12-12 DIAGNOSIS — Z950 Presence of cardiac pacemaker: Secondary | ICD-10-CM | POA: Diagnosis not present

## 2020-12-12 DIAGNOSIS — Z7984 Long term (current) use of oral hypoglycemic drugs: Secondary | ICD-10-CM | POA: Diagnosis not present

## 2020-12-12 DIAGNOSIS — Z888 Allergy status to other drugs, medicaments and biological substances status: Secondary | ICD-10-CM | POA: Insufficient documentation

## 2020-12-12 DIAGNOSIS — F17200 Nicotine dependence, unspecified, uncomplicated: Secondary | ICD-10-CM | POA: Diagnosis not present

## 2020-12-12 DIAGNOSIS — Z884 Allergy status to anesthetic agent status: Secondary | ICD-10-CM | POA: Insufficient documentation

## 2020-12-12 DIAGNOSIS — Y939 Activity, unspecified: Secondary | ICD-10-CM | POA: Insufficient documentation

## 2020-12-12 DIAGNOSIS — X58XXXA Exposure to other specified factors, initial encounter: Secondary | ICD-10-CM | POA: Insufficient documentation

## 2020-12-12 DIAGNOSIS — S63203A Unspecified subluxation of left middle finger, initial encounter: Secondary | ICD-10-CM | POA: Diagnosis not present

## 2020-12-12 HISTORY — DX: Essential (primary) hypertension: I10

## 2020-12-12 HISTORY — PX: REPAIR EXTENSOR TENDON: SHX5382

## 2020-12-12 HISTORY — DX: Presence of cardiac pacemaker: Z95.0

## 2020-12-12 HISTORY — DX: Other complications of anesthesia, initial encounter: T88.59XA

## 2020-12-12 HISTORY — DX: Type 2 diabetes mellitus without complications: E11.9

## 2020-12-12 LAB — GLUCOSE, CAPILLARY
Glucose-Capillary: 117 mg/dL — ABNORMAL HIGH (ref 70–99)
Glucose-Capillary: 102 mg/dL — ABNORMAL HIGH (ref 70–99)

## 2020-12-12 SURGERY — REPAIR, TENDON, EXTENSOR
Anesthesia: General | Site: Hand | Laterality: Right

## 2020-12-12 MED ORDER — EPHEDRINE SULFATE 50 MG/ML IJ SOLN
INTRAMUSCULAR | Status: DC | PRN
  Administered 2020-12-12 (×2): 10 mg via INTRAVENOUS

## 2020-12-12 MED ORDER — PROPOFOL 10 MG/ML IV BOLUS
INTRAVENOUS | Status: DC | PRN
  Administered 2020-12-12: 150 mg via INTRAVENOUS

## 2020-12-12 MED ORDER — PHENYLEPHRINE HCL (PRESSORS) 10 MG/ML IV SOLN
INTRAVENOUS | Status: DC | PRN
  Administered 2020-12-12: 80 ug via INTRAVENOUS

## 2020-12-12 MED ORDER — FENTANYL CITRATE (PF) 100 MCG/2ML IJ SOLN
INTRAMUSCULAR | Status: AC
  Filled 2020-12-12: qty 2

## 2020-12-12 MED ORDER — FENTANYL CITRATE (PF) 100 MCG/2ML IJ SOLN
INTRAMUSCULAR | Status: DC | PRN
  Administered 2020-12-12: 50 ug via INTRAVENOUS

## 2020-12-12 MED ORDER — FENTANYL CITRATE (PF) 100 MCG/2ML IJ SOLN
25.0000 ug | INTRAMUSCULAR | Status: DC | PRN
Start: 2020-12-12 — End: 2020-12-12
  Administered 2020-12-12: 50 ug via INTRAVENOUS

## 2020-12-12 MED ORDER — BUPIVACAINE HCL (PF) 0.25 % IJ SOLN
INTRAMUSCULAR | Status: DC | PRN
  Administered 2020-12-12: 6 mL

## 2020-12-12 MED ORDER — CEFAZOLIN SODIUM-DEXTROSE 2-4 GM/100ML-% IV SOLN
2.0000 g | INTRAVENOUS | Status: AC
  Administered 2020-12-12: 2 g via INTRAVENOUS

## 2020-12-12 MED ORDER — LIDOCAINE 2% (20 MG/ML) 5 ML SYRINGE
INTRAMUSCULAR | Status: DC | PRN
  Administered 2020-12-12: 60 mg via INTRAVENOUS

## 2020-12-12 MED ORDER — ACETAMINOPHEN 500 MG PO TABS
1000.0000 mg | ORAL_TABLET | Freq: Once | ORAL | Status: AC
  Administered 2020-12-12: 1000 mg via ORAL

## 2020-12-12 MED ORDER — CEFAZOLIN SODIUM-DEXTROSE 2-4 GM/100ML-% IV SOLN
INTRAVENOUS | Status: AC
  Filled 2020-12-12: qty 100

## 2020-12-12 MED ORDER — LACTATED RINGERS IV SOLN: INTRAVENOUS | Status: DC

## 2020-12-12 MED ORDER — DEXAMETHASONE SODIUM PHOSPHATE 10 MG/ML IJ SOLN
INTRAMUSCULAR | Status: DC | PRN
  Administered 2020-12-12: 4 mg via INTRAVENOUS

## 2020-12-12 MED ORDER — AMISULPRIDE (ANTIEMETIC) 5 MG/2ML IV SOLN: 10.0000 mg | Freq: Once | INTRAVENOUS | Status: DC | PRN

## 2020-12-12 MED ORDER — ACETAMINOPHEN 500 MG PO TABS
ORAL_TABLET | ORAL | Status: AC
  Filled 2020-12-12: qty 2

## 2020-12-12 MED ORDER — HYDROCODONE-ACETAMINOPHEN 5-325 MG PO TABS: ORAL_TABLET | ORAL | 0 refills | Status: DC

## 2020-12-12 SURGICAL SUPPLY — 83 items
APL PRP STRL LF DISP 70% ISPRP (MISCELLANEOUS) ×1
APL SKNCLS STERI-STRIP NONHPOA (GAUZE/BANDAGES/DRESSINGS)
BAG DECANTER FOR FLEXI CONT (MISCELLANEOUS) IMPLANT
BALL CTTN LRG ABS STRL LF (GAUZE/BANDAGES/DRESSINGS)
BENZOIN TINCTURE PRP APPL 2/3 (GAUZE/BANDAGES/DRESSINGS) IMPLANT
BLADE MINI RND TIP GREEN BEAV (BLADE) IMPLANT
BLADE SURG 15 STRL LF DISP TIS (BLADE) ×2 IMPLANT
BLADE SURG 15 STRL SS (BLADE) ×4
BNDG CMPR 9X4 STRL LF SNTH (GAUZE/BANDAGES/DRESSINGS) ×1
BNDG CONFORM 2 STRL LF (GAUZE/BANDAGES/DRESSINGS) IMPLANT
BNDG ELASTIC 2X5.8 VLCR STR LF (GAUZE/BANDAGES/DRESSINGS) IMPLANT
BNDG ELASTIC 3X5.8 VLCR STR LF (GAUZE/BANDAGES/DRESSINGS) ×2 IMPLANT
BNDG ESMARK 4X9 LF (GAUZE/BANDAGES/DRESSINGS) ×2 IMPLANT
BNDG GAUZE ELAST 4 BULKY (GAUZE/BANDAGES/DRESSINGS) IMPLANT
CHLORAPREP W/TINT 26 (MISCELLANEOUS) ×2 IMPLANT
CORD BIPOLAR FORCEPS 12FT (ELECTRODE) ×2 IMPLANT
COTTONBALL LRG STERILE PKG (GAUZE/BANDAGES/DRESSINGS) IMPLANT
COVER BACK TABLE 60X90IN (DRAPES) ×2 IMPLANT
COVER MAYO STAND STRL (DRAPES) ×2 IMPLANT
COVER WAND RF STERILE (DRAPES) IMPLANT
CUFF TOURN SGL QUICK 18X4 (TOURNIQUET CUFF) ×2 IMPLANT
DECANTER SPIKE VIAL GLASS SM (MISCELLANEOUS) IMPLANT
DRAIN TLS ROUND 10FR (DRAIN) IMPLANT
DRAPE EXTREMITY T 121X128X90 (DISPOSABLE) ×2 IMPLANT
DRAPE OEC MINIVIEW 54X84 (DRAPES) IMPLANT
DRAPE SURG 17X23 STRL (DRAPES) ×2 IMPLANT
DRSG PAD ABDOMINAL 8X10 ST (GAUZE/BANDAGES/DRESSINGS) IMPLANT
GAUZE 4X4 16PLY RFD (DISPOSABLE) IMPLANT
GAUZE SPONGE 4X4 12PLY STRL (GAUZE/BANDAGES/DRESSINGS) ×2 IMPLANT
GAUZE XEROFORM 1X8 LF (GAUZE/BANDAGES/DRESSINGS) ×2 IMPLANT
GLOVE BIO SURGEON STRL SZ7.5 (GLOVE) ×2 IMPLANT
GLOVE SRG 8 PF TXTR STRL LF DI (GLOVE) ×1 IMPLANT
GLOVE SURG UNDER POLY LF SZ8 (GLOVE) ×2
GOWN STRL REUS W/ TWL LRG LVL3 (GOWN DISPOSABLE) ×1 IMPLANT
GOWN STRL REUS W/TWL LRG LVL3 (GOWN DISPOSABLE) ×2
GOWN STRL REUS W/TWL XL LVL3 (GOWN DISPOSABLE) ×2 IMPLANT
K-WIRE .035X4 (WIRE) IMPLANT
LOOP VESSEL MAXI BLUE (MISCELLANEOUS) IMPLANT
NEEDLE HYPO 22GX1.5 SAFETY (NEEDLE) IMPLANT
NEEDLE HYPO 25X1 1.5 SAFETY (NEEDLE) IMPLANT
NEEDLE KEITH (NEEDLE) IMPLANT
NS IRRIG 1000ML POUR BTL (IV SOLUTION) ×2 IMPLANT
PACK BASIN DAY SURGERY FS (CUSTOM PROCEDURE TRAY) ×2 IMPLANT
PAD CAST 3X4 CTTN HI CHSV (CAST SUPPLIES) ×1 IMPLANT
PAD CAST 4YDX4 CTTN HI CHSV (CAST SUPPLIES) IMPLANT
PADDING CAST ABS 3INX4YD NS (CAST SUPPLIES)
PADDING CAST ABS 4INX4YD NS (CAST SUPPLIES) ×1
PADDING CAST ABS COTTON 3X4 (CAST SUPPLIES) IMPLANT
PADDING CAST ABS COTTON 4X4 ST (CAST SUPPLIES) ×1 IMPLANT
PADDING CAST COTTON 3X4 STRL (CAST SUPPLIES) ×2
PADDING CAST COTTON 4X4 STRL (CAST SUPPLIES)
SLEEVE SCD COMPRESS KNEE MED (MISCELLANEOUS) IMPLANT
SPLINT PLASTER CAST XFAST 3X15 (CAST SUPPLIES) IMPLANT
SPLINT PLASTER CAST XFAST 4X15 (CAST SUPPLIES) IMPLANT
SPLINT PLASTER XTRA FAST SET 4 (CAST SUPPLIES)
SPLINT PLASTER XTRA FASTSET 3X (CAST SUPPLIES)
STOCKINETTE 4X48 STRL (DRAPES) ×2 IMPLANT
STRIP CLOSURE SKIN 1/2X4 (GAUZE/BANDAGES/DRESSINGS) IMPLANT
SUT CHROMIC 5 0 P 3 (SUTURE) IMPLANT
SUT ETHIBOND 3-0 V-5 (SUTURE) IMPLANT
SUT ETHILON 3 0 PS 1 (SUTURE) IMPLANT
SUT ETHILON 4 0 PS 2 18 (SUTURE) IMPLANT
SUT FIBERWIRE 3-0 18 TAPR NDL (SUTURE)
SUT FIBERWIRE 4-0 18 DIAM BLUE (SUTURE)
SUT MERSILENE 2.0 SH NDLE (SUTURE) IMPLANT
SUT MERSILENE 4 0 P 3 (SUTURE) IMPLANT
SUT MNCRL AB 4-0 PS2 18 (SUTURE) IMPLANT
SUT PROLENE 2 0 SH DA (SUTURE) IMPLANT
SUT PROLENE 6 0 P 1 18 (SUTURE) IMPLANT
SUT SILK 2 0 PERMA HAND 18 BK (SUTURE) IMPLANT
SUT SILK 4 0 PS 2 (SUTURE) IMPLANT
SUT VIC AB 3-0 PS1 18 (SUTURE)
SUT VIC AB 3-0 PS1 18XBRD (SUTURE) IMPLANT
SUT VIC AB 4-0 P-3 18XBRD (SUTURE) IMPLANT
SUT VIC AB 4-0 P3 18 (SUTURE)
SUT VICRYL 4-0 PS2 18IN ABS (SUTURE) IMPLANT
SUTURE FIBERWR 3-0 18 TAPR NDL (SUTURE) IMPLANT
SUTURE FIBERWR 4-0 18 DIA BLUE (SUTURE) IMPLANT
SYR BULB EAR ULCER 3OZ GRN STR (SYRINGE) ×2 IMPLANT
SYR CONTROL 10ML LL (SYRINGE) IMPLANT
TOWEL GREEN STERILE FF (TOWEL DISPOSABLE) ×4 IMPLANT
TUBE FEEDING ENTERAL 5FR 16IN (TUBING) IMPLANT
UNDERPAD 30X36 HEAVY ABSORB (UNDERPADS AND DIAPERS) ×2 IMPLANT

## 2020-12-12 NOTE — Anesthesia Procedure Notes (Signed)
Procedure Name: LMA Insertion Date/Time: 12/12/2020 11:50 AM Performed by: Burna Cash, CRNA Pre-anesthesia Checklist: Patient identified, Emergency Drugs available, Suction available and Patient being monitored Patient Re-evaluated:Patient Re-evaluated prior to induction Oxygen Delivery Method: Circle system utilized Preoxygenation: Pre-oxygenation with 100% oxygen Induction Type: IV induction Ventilation: Mask ventilation without difficulty LMA: LMA inserted LMA Size: 4.0 Number of attempts: 1 Airway Equipment and Method: Bite block Placement Confirmation: positive ETCO2 Tube secured with: Tape Dental Injury: Teeth and Oropharynx as per pre-operative assessment

## 2020-12-12 NOTE — H&P (Signed)
  Jacqueline Fitzpatrick is an 75 y.o. female.   Chief Complaint: tendon subluxation HPI: 75 yo female with right long finger extensor tendon subluxation.  She has treated this with splinting without lasting relief.  She wishes to proceed with operative extensor tendon centralization.  Allergies:  Allergies  Allergen Reactions  . Metformin Anaphylaxis  . Anesthesia S-I-40 [Propofol] Other (See Comments)    Had trouble being woken up from anesthesia and then couldn't breathe when they did get her woken up.    Past Medical History:  Diagnosis Date  . Complication of anesthesia    hard to wake up  . Diabetes mellitus without complication (HCC)   . Hypertension   . Presence of permanent cardiac pacemaker   . Second degree Mobitz II AV block    s/p PPM    Past Surgical History:  Procedure Laterality Date  . CHOLECYSTECTOMY    . PACEMAKER IMPLANT N/A 05/28/2020   Procedure: PACEMAKER IMPLANT;  Surgeon: Hillis Range, MD;  Location: [facility] INVASIVE CV LAB;  Service: Cardiovascular;  Laterality: N/A;    Family History: History reviewed. No pertinent family history.  Social History:   reports that she has been smoking. She has never used smokeless tobacco. She reports previous alcohol use. She reports that she does not use drugs.  Medications: No medications prior to admission.    Results for orders placed or performed during the hospital encounter of 12/12/20 (from the past 48 hour(s))  Basic metabolic panel per protocol     Status: Abnormal   Collection Time: 12/10/20  4:00 PM  Result Value Ref Range   Sodium 136 135 - 145 mmol/L   Potassium 4.5 3.5 - 5.1 mmol/L   Chloride 103 98 - 111 mmol/L   CO2 21 (L) 22 - 32 mmol/L   Glucose, Bld 231 (H) 70 - 99 mg/dL    Comment: Glucose reference range applies only to samples taken after fasting for at least 8 hours.   BUN 22 8 - 23 mg/dL   Creatinine, Ser 9.23 0.44 - 1.00 mg/dL   Calcium 9.4 8.9 - 30.0 mg/dL   GFR, Estimated >76 >22 mL/min     Comment: (NOTE) Calculated using the CKD-EPI Creatinine Equation (2021)    Anion gap 12 5 - 15    Comment: Performed at [facility], [address] [place], [place] [address]    No results found.   A comprehensive review of systems was negative.  Height 5\' 2"  (1.575 m), weight 85.3 kg.  General appearance: alert, cooperative and appears stated age Head: Normocephalic, without obvious abnormality, atraumatic Neck: supple, symmetrical, trachea midline Cardio: regular rate and rhythm Resp: clear to auscultation bilaterally Extremities: Intact sensation and capillary refill all digits.  +epl/fpl/io.  No wounds. Subluxation right long finger extensor tendon. Pulses: 2+ and symmetric Skin: Skin color, texture, turgor normal. No rashes or lesions Neurologic: Grossly normal Incision/Wound: none  Assessment/Plan Right long finger extensor tendon subluxation.  Non operative and operative treatment options have been discussed with the patient and patient wishes to proceed with operative treatment. Risks, benefits, and alternatives of surgery have been discussed and the patient agrees with the plan of care.   12/12/2020, 9:41 AM

## 2020-12-12 NOTE — Transfer of Care (Signed)
Immediate Anesthesia Transfer of Care Note  Patient: Jacqueline Fitzpatrick  Procedure(s) Performed: RIGHT LONG FINGER EXTENSOR TENDON SUBLUXATION REPAIR (Right Hand)  Patient Location: PACU  Anesthesia Type:General  Level of Consciousness: awake, alert  and oriented  Airway & Oxygen Therapy: Patient Spontanous Breathing and Patient connected to face mask oxygen  Post-op Assessment: Report given to RN and Post -op Vital signs reviewed and stable  Post vital signs: Reviewed and stable  Last Vitals:  Vitals Value Taken Time  BP 167/65 12/12/20 1233  Temp 36.7 C 12/12/20 1233  Pulse 87 12/12/20 1237  Resp 17 12/12/20 1237  SpO2 100 % 12/12/20 1237  Vitals shown include unvalidated device data.  Last Pain:  Vitals:   12/12/20 1059  TempSrc: Oral  PainSc: 0-No pain         Complications: No complications documented.

## 2020-12-12 NOTE — Anesthesia Postprocedure Evaluation (Signed)
Anesthesia Post Note  Patient: Jacqueline Fitzpatrick  Procedure(s) Performed: RIGHT LONG FINGER EXTENSOR TENDON SUBLUXATION REPAIR (Right Hand)     Patient location during evaluation: PACU Anesthesia Type: General Level of consciousness: awake and alert Pain management: pain level controlled Vital Signs Assessment: post-procedure vital signs reviewed and stable Respiratory status: spontaneous breathing, nonlabored ventilation, respiratory function stable and patient connected to nasal cannula oxygen Cardiovascular status: blood pressure returned to baseline and stable Postop Assessment: no apparent nausea or vomiting Anesthetic complications: no   No complications documented.  Last Vitals:  Vitals:   12/12/20 1307 12/12/20 1318  BP: 119/60 124/63  Pulse: 84 87  Resp: 14 17  Temp:  36.7 C  SpO2: 94% 94%    Last Pain:  Vitals:   12/12/20 1318  TempSrc:   PainSc: 4                  Kennieth Rad

## 2020-12-12 NOTE — Op Note (Addendum)
NAME: Jacqueline Fitzpatrick MEDICAL RECORD NO: [id number] DATE OF BIRTH: Mar 21, 1946 FACILITY: [facility] LOCATION: [facility] PHYSICIAN: Tami Ribas, MD   OPERATIVE REPORT   DATE OF PROCEDURE: 12/12/20    PREOPERATIVE DIAGNOSIS:   Right long finger extensor tendon subluxation   POSTOPERATIVE DIAGNOSIS:   Right long finger extensor tendon subluxation   PROCEDURE:   Right long finger centralization of extensor tendon at MP joint   SURGEON:  Betha Loa, M.D.   ASSISTANT: Cindee Salt, MD   ANESTHESIA:  General   INTRAVENOUS FLUIDS:  Per anesthesia flow sheet.   ESTIMATED BLOOD LOSS:  Minimal.   COMPLICATIONS:  None.   SPECIMENS:  none   TOURNIQUET TIME:    Total Tourniquet Time Documented: Forearm (Right) - 22 minutes Total: Forearm (Right) - 22 minutes    DISPOSITION:  Stable to PACU.   INDICATIONS: 75 year old female with subluxation of right long finger extensor tendon.  She is treated this with splinting without lasting relief.  She wishes to have extensor tendon centralization. Risks, benefits and alternatives of surgery were discussed including the risks of blood loss, infection, damage to nerves, vessels, tendons, ligaments, bone for surgery, need for additional surgery, complications with wound healing, continued pain, stiffness.  She voiced understanding of these risks and elected to proceed.  OPERATIVE COURSE:  After being identified preoperatively by myself,  the patient and I agreed on the procedure and site of the procedure.  The surgical site was marked.  Surgical consent had been signed. She was given IV antibiotics as preoperative antibiotic prophylaxis. She was transferred to the operating room and placed on the operating table in supine position with the Right upper extremity on an arm board.  General anesthesia was induced by the anesthesiologist.  Right upper extremity was prepped and draped in normal sterile orthopedic fashion.  A surgical pause  was performed between the surgeons, anesthesia, and operating room staff and all were in agreement as to the patient, procedure, and site of procedure.  Tourniquet at the proximal aspect of the extremity was inflated to 250 mmHg after exsanguination of the arm with an Esmarch bandage.    Incision was made on the dorsum of the finger over the MP joint.  This was carried in subcutaneous tissues by spreading technique.  The extensor tendon was easily identified.  Was subluxate into the ulnar side.  The ulnar sagittal band was tight.  The radial sagittal band tissues were thickened.  It was felt that an imbrication would be appropriate for centralization.  The ulnar sagittal band was freed up and released with the [facility] blade.  The radial sagittal band was also released to allow imbrication.  The undersurface of the tendon was freed up of scar attachments to allow centralization.  A 4-0 Mersilene suture was used in a horizontal mattress and figure-of-eight fashion to imbricate the radial sagittal band.  This centralized to the extensor tendon over the MP joint.  Finger was able to be flexed and the tendon stayed centralized.  The wound was copiously irrigated with sterile saline.  Was then closed with 4-0 nylon in a horizontal mattress fashion.  It was injected with quarter percent plain Marcaine to aid in postoperative analgesia.  It was dressed with sterile Xeroform 4 x 4's and wrapped with a Kerlix bandage.  Volar splints placed and wrapped with Kerlix and Ace bandage.  The tourniquet was deflated at 22 minutes.  Fingertips were pink with brisk capillary refill after deflation of tourniquet.  The operative  drapes were broken down.  The patient was awoken from anesthesia safely.  She was transferred back to the stretcher and taken to PACU in stable condition.  I will see her back in the office in 1 week for postoperative followup.  I will give her a prescription for Norco 5/325 1-2 tabs PO q6 hours prn pain,  dispense # 15.   Betha Loa, MD Electronically signed, 12/12/20

## 2020-12-12 NOTE — Op Note (Signed)
I assisted Surgeon(s) and Role:    * Betha Loa, MD - Primary    Cindee Salt, MD - Assisting on the Procedure(s): RIGHT LONG FINGER EXTENSOR TENDON SUBLUXATION REPAIR on 12/12/2020.  I provided assistance on this case as follows: Set up, approach, identification of lesion,, release of the ulnar side of the extensor tendon with reading repair of the radial side sagittal bands, closure of the wound application dressing and splint. Electronically signed by: Cindee Salt, MD Date: 12/12/2020 Time: 12:26 PM

## 2020-12-12 NOTE — Anesthesia Preprocedure Evaluation (Addendum)
Anesthesia Evaluation  Patient identified by MRN, date of birth, ID band Patient awake    Reviewed: Allergy & Precautions, NPO status , Patient's Chart, lab work & pertinent test results  Airway Mallampati: II  TM Distance: >3 FB Neck ROM: Full    Dental  (+) Dental Advisory Given   Pulmonary Current Smoker,    breath sounds clear to auscultation       Cardiovascular hypertension, + dysrhythmias + pacemaker  Rhythm:Regular Rate:Normal     Neuro/Psych negative neurological ROS     GI/Hepatic negative GI ROS, Neg liver ROS,   Endo/Other  diabetes, Type 2, Oral Hypoglycemic Agents  Renal/GU      Musculoskeletal   Abdominal   Peds  Hematology negative hematology ROS (+)   Anesthesia Other Findings   Reproductive/Obstetrics                             Anesthesia Physical Anesthesia Plan  ASA: II  Anesthesia Plan: General   Post-op Pain Management:    Induction: Intravenous  PONV Risk Score and Plan: 2 and Dexamethasone, Ondansetron and Treatment may vary due to age or medical condition  Airway Management Planned: LMA  Additional Equipment:   Intra-op Plan:   Post-operative Plan: Extubation in OR  Informed Consent: I have reviewed the patients History and Physical, chart, labs and discussed the procedure including the risks, benefits and alternatives for the proposed anesthesia with the patient or authorized representative who has indicated his/her understanding and acceptance.     Dental advisory given  Plan Discussed with: CRNA  Anesthesia Plan Comments:        Anesthesia Quick Evaluation

## 2020-12-12 NOTE — Op Note (Deleted)
I assisted Surgeon(s) and Role:    * Betha Loa, MD - Primary    Cindee Salt, MD - Assisting on the Procedure(s): RIGHT LONG FINGER EXTENSOR TENDON SUBLUXATION REPAIR on 12/12/2020.  I provided assistance on this case as follows: setup and management of arthroscopy equipment.  Electronically signed by: Betha Loa, MD Date: 12/12/2020 Time: 11:15 AM

## 2020-12-12 NOTE — Discharge Instructions (Addendum)
Next dose of Tylenol can be given at 5:15pm if needed.   Post Anesthesia Home Care Instructions  Activity: Get plenty of rest for the remainder of the day. A responsible individual must stay with you for 24 hours following the procedure.  For the next 24 hours, DO NOT: -Drive a car -Advertising copywriter -Drink alcoholic beverages -Take any medication unless instructed by your physician -Make any legal decisions or sign important papers.  Meals: Start with liquid foods such as gelatin or soup. Progress to regular foods as tolerated. Avoid greasy, spicy, heavy foods. If nausea and/or vomiting occur, drink only clear liquids until the nausea and/or vomiting subsides. Call your physician if vomiting continues.  Special Instructions/Symptoms: Your throat may feel dry or sore from the anesthesia or the breathing tube placed in your throat during surgery. If this causes discomfort, gargle with warm salt water. The discomfort should disappear within 24 hours.     [facility] Center Instructions Hand Surgery  Wound Care: Keep your hand elevated above the level of your heart.  Do not allow it to dangle by your side.  Keep the dressing dry and do not remove it unless your doctor advises you to do so.  He will usually change it at the time of your post-op visit.  Moving your fingers is advised to stimulate circulation but will depend on the site of your surgery.  If you have a splint applied, your doctor will advise you regarding movement.  Activity: Do not drive or operate machinery today.  Rest today and then you may return to your normal activity and work as indicated by your physician.  Diet:  Drink liquids today or eat a light diet.  You may resume a regular diet tomorrow.    General expectations: Pain for two to three days. Fingers may become slightly swollen.  Call your doctor if any of the following occur: Severe pain not relieved by pain medication. Elevated temperature. Dressing soaked  with blood. Inability to move fingers. White or bluish color to fingers.

## 2020-12-13 ENCOUNTER — Encounter (HOSPITAL_BASED_OUTPATIENT_CLINIC_OR_DEPARTMENT_OTHER): Payer: Self-pay | Admitting: Orthopedic Surgery

## 2020-12-24 DIAGNOSIS — I152 Hypertension secondary to endocrine disorders: Secondary | ICD-10-CM | POA: Diagnosis not present

## 2020-12-24 DIAGNOSIS — E782 Mixed hyperlipidemia: Secondary | ICD-10-CM | POA: Diagnosis not present

## 2020-12-24 DIAGNOSIS — E1169 Type 2 diabetes mellitus with other specified complication: Secondary | ICD-10-CM | POA: Diagnosis not present

## 2020-12-24 DIAGNOSIS — E1159 Type 2 diabetes mellitus with other circulatory complications: Secondary | ICD-10-CM | POA: Diagnosis not present

## 2021-01-06 DIAGNOSIS — M79644 Pain in right finger(s): Secondary | ICD-10-CM | POA: Diagnosis not present

## 2021-01-06 DIAGNOSIS — M25441 Effusion, right hand: Secondary | ICD-10-CM | POA: Diagnosis not present

## 2021-01-06 DIAGNOSIS — M25641 Stiffness of right hand, not elsewhere classified: Secondary | ICD-10-CM | POA: Diagnosis not present

## 2021-01-06 DIAGNOSIS — M6281 Muscle weakness (generalized): Secondary | ICD-10-CM | POA: Diagnosis not present

## 2021-01-13 DIAGNOSIS — S8264XA Nondisplaced fracture of lateral malleolus of right fibula, initial encounter for closed fracture: Secondary | ICD-10-CM | POA: Diagnosis not present

## 2021-01-13 DIAGNOSIS — M25441 Effusion, right hand: Secondary | ICD-10-CM | POA: Diagnosis not present

## 2021-01-13 DIAGNOSIS — M79644 Pain in right finger(s): Secondary | ICD-10-CM | POA: Diagnosis not present

## 2021-01-13 DIAGNOSIS — M6281 Muscle weakness (generalized): Secondary | ICD-10-CM | POA: Diagnosis not present

## 2021-01-13 DIAGNOSIS — M25641 Stiffness of right hand, not elsewhere classified: Secondary | ICD-10-CM | POA: Diagnosis not present

## 2021-01-21 DIAGNOSIS — E782 Mixed hyperlipidemia: Secondary | ICD-10-CM | POA: Diagnosis not present

## 2021-01-21 DIAGNOSIS — E1159 Type 2 diabetes mellitus with other circulatory complications: Secondary | ICD-10-CM | POA: Diagnosis not present

## 2021-01-21 DIAGNOSIS — I152 Hypertension secondary to endocrine disorders: Secondary | ICD-10-CM | POA: Diagnosis not present

## 2021-01-21 DIAGNOSIS — E1169 Type 2 diabetes mellitus with other specified complication: Secondary | ICD-10-CM | POA: Diagnosis not present

## 2021-01-24 DIAGNOSIS — M79644 Pain in right finger(s): Secondary | ICD-10-CM | POA: Diagnosis not present

## 2021-01-24 DIAGNOSIS — M6281 Muscle weakness (generalized): Secondary | ICD-10-CM | POA: Diagnosis not present

## 2021-01-24 DIAGNOSIS — M25441 Effusion, right hand: Secondary | ICD-10-CM | POA: Diagnosis not present

## 2021-01-24 DIAGNOSIS — M25641 Stiffness of right hand, not elsewhere classified: Secondary | ICD-10-CM | POA: Diagnosis not present

## 2021-01-28 DIAGNOSIS — E1169 Type 2 diabetes mellitus with other specified complication: Secondary | ICD-10-CM | POA: Diagnosis not present

## 2021-01-30 DIAGNOSIS — M25641 Stiffness of right hand, not elsewhere classified: Secondary | ICD-10-CM | POA: Diagnosis not present

## 2021-01-30 DIAGNOSIS — M79644 Pain in right finger(s): Secondary | ICD-10-CM | POA: Diagnosis not present

## 2021-01-30 DIAGNOSIS — M25441 Effusion, right hand: Secondary | ICD-10-CM | POA: Diagnosis not present

## 2021-01-30 DIAGNOSIS — M6281 Muscle weakness (generalized): Secondary | ICD-10-CM | POA: Diagnosis not present

## 2021-02-05 DIAGNOSIS — E1159 Type 2 diabetes mellitus with other circulatory complications: Secondary | ICD-10-CM | POA: Diagnosis not present

## 2021-02-05 DIAGNOSIS — E1169 Type 2 diabetes mellitus with other specified complication: Secondary | ICD-10-CM | POA: Diagnosis not present

## 2021-02-05 DIAGNOSIS — N1831 Chronic kidney disease, stage 3a: Secondary | ICD-10-CM | POA: Diagnosis not present

## 2021-02-05 DIAGNOSIS — E782 Mixed hyperlipidemia: Secondary | ICD-10-CM | POA: Diagnosis not present

## 2021-02-06 DIAGNOSIS — M25641 Stiffness of right hand, not elsewhere classified: Secondary | ICD-10-CM | POA: Diagnosis not present

## 2021-02-06 DIAGNOSIS — M6281 Muscle weakness (generalized): Secondary | ICD-10-CM | POA: Diagnosis not present

## 2021-02-06 DIAGNOSIS — M25441 Effusion, right hand: Secondary | ICD-10-CM | POA: Diagnosis not present

## 2021-02-06 DIAGNOSIS — M79644 Pain in right finger(s): Secondary | ICD-10-CM | POA: Diagnosis not present

## 2021-02-13 DIAGNOSIS — M6281 Muscle weakness (generalized): Secondary | ICD-10-CM | POA: Diagnosis not present

## 2021-02-13 DIAGNOSIS — M25441 Effusion, right hand: Secondary | ICD-10-CM | POA: Diagnosis not present

## 2021-02-13 DIAGNOSIS — M25641 Stiffness of right hand, not elsewhere classified: Secondary | ICD-10-CM | POA: Diagnosis not present

## 2021-02-13 DIAGNOSIS — M79644 Pain in right finger(s): Secondary | ICD-10-CM | POA: Diagnosis not present

## 2021-02-21 DIAGNOSIS — Z6834 Body mass index (BMI) 34.0-34.9, adult: Secondary | ICD-10-CM | POA: Diagnosis not present

## 2021-02-21 DIAGNOSIS — E1159 Type 2 diabetes mellitus with other circulatory complications: Secondary | ICD-10-CM | POA: Diagnosis not present

## 2021-02-21 DIAGNOSIS — I129 Hypertensive chronic kidney disease with stage 1 through stage 4 chronic kidney disease, or unspecified chronic kidney disease: Secondary | ICD-10-CM | POA: Diagnosis not present

## 2021-02-26 ENCOUNTER — Ambulatory Visit (INDEPENDENT_AMBULATORY_CARE_PROVIDER_SITE_OTHER): Payer: Medicare Other

## 2021-02-26 DIAGNOSIS — Z950 Presence of cardiac pacemaker: Secondary | ICD-10-CM

## 2021-02-26 DIAGNOSIS — I459 Conduction disorder, unspecified: Secondary | ICD-10-CM

## 2021-02-27 DIAGNOSIS — M6281 Muscle weakness (generalized): Secondary | ICD-10-CM | POA: Diagnosis not present

## 2021-02-27 DIAGNOSIS — M79644 Pain in right finger(s): Secondary | ICD-10-CM | POA: Diagnosis not present

## 2021-02-27 DIAGNOSIS — M25641 Stiffness of right hand, not elsewhere classified: Secondary | ICD-10-CM | POA: Diagnosis not present

## 2021-02-27 DIAGNOSIS — M25441 Effusion, right hand: Secondary | ICD-10-CM | POA: Diagnosis not present

## 2021-02-28 LAB — CUP PACEART REMOTE DEVICE CHECK
Battery Remaining Longevity: 130 mo
Battery Remaining Percentage: 95.5 %
Battery Voltage: 3.02 V
Brady Statistic AP VP Percent: 1 %
Brady Statistic AP VS Percent: 8.8 %
Brady Statistic AS VP Percent: 1 %
Brady Statistic AS VS Percent: 91 %
Brady Statistic RA Percent Paced: 8.6 %
Brady Statistic RV Percent Paced: 1 %
Date Time Interrogation Session: 20220406052138
Implantable Lead Implant Date: 20210706
Implantable Lead Implant Date: 20210706
Implantable Lead Location: 753859
Implantable Lead Location: 753860
Implantable Pulse Generator Implant Date: 20210706
Lead Channel Impedance Value: 430 Ohm
Lead Channel Impedance Value: 480 Ohm
Lead Channel Pacing Threshold Amplitude: 0.5 V
Lead Channel Pacing Threshold Amplitude: 0.75 V
Lead Channel Pacing Threshold Pulse Width: 0.4 ms
Lead Channel Pacing Threshold Pulse Width: 0.4 ms
Lead Channel Sensing Intrinsic Amplitude: 2.5 mV
Lead Channel Sensing Intrinsic Amplitude: 4.1 mV
Lead Channel Setting Pacing Amplitude: 1 V
Lead Channel Setting Pacing Amplitude: 2 V
Lead Channel Setting Pacing Pulse Width: 0.4 ms
Lead Channel Setting Sensing Sensitivity: 2 mV
Pulse Gen Model: 2272
Pulse Gen Serial Number: 3847058

## 2021-03-10 NOTE — Progress Notes (Signed)
Remote pacemaker transmission.   

## 2021-03-23 DIAGNOSIS — E1159 Type 2 diabetes mellitus with other circulatory complications: Secondary | ICD-10-CM | POA: Diagnosis not present

## 2021-03-23 DIAGNOSIS — I129 Hypertensive chronic kidney disease with stage 1 through stage 4 chronic kidney disease, or unspecified chronic kidney disease: Secondary | ICD-10-CM | POA: Diagnosis not present

## 2021-05-19 ENCOUNTER — Telehealth: Payer: Self-pay | Admitting: Internal Medicine

## 2021-05-19 NOTE — Telephone Encounter (Signed)
Attempted to contact patient. No answer, unable to leave VM.

## 2021-05-19 NOTE — Telephone Encounter (Signed)
Left message to call back. Also that device clinic would be reaching out to get a transmission for PPM to see if anything occurred over the weekend.

## 2021-05-19 NOTE — Telephone Encounter (Signed)
New message    Pt granddaughter called asking for an appt with JA or EP APP. States pt had some chest tightness over the weekend. Pt scheduled for appt on Wed with [facility]

## 2021-05-20 DIAGNOSIS — E1159 Type 2 diabetes mellitus with other circulatory complications: Secondary | ICD-10-CM | POA: Diagnosis not present

## 2021-05-20 DIAGNOSIS — I152 Hypertension secondary to endocrine disorders: Secondary | ICD-10-CM | POA: Diagnosis not present

## 2021-05-20 DIAGNOSIS — R059 Cough, unspecified: Secondary | ICD-10-CM | POA: Diagnosis not present

## 2021-05-20 DIAGNOSIS — Z20822 Contact with and (suspected) exposure to covid-19: Secondary | ICD-10-CM | POA: Diagnosis not present

## 2021-05-20 DIAGNOSIS — R079 Chest pain, unspecified: Secondary | ICD-10-CM | POA: Diagnosis not present

## 2021-05-20 DIAGNOSIS — Z6834 Body mass index (BMI) 34.0-34.9, adult: Secondary | ICD-10-CM | POA: Diagnosis not present

## 2021-05-20 DIAGNOSIS — R0789 Other chest pain: Secondary | ICD-10-CM | POA: Diagnosis not present

## 2021-05-20 NOTE — Progress Notes (Signed)
Cardiology Office Note Date:  05/21/2021  Patient ID:  Jacqueline Fitzpatrick, Jacqueline Fitzpatrick [birth year], MRN [id number] PCP:  Charlott Rakes, MD Cardiologist: Dr. Tomie China (saw her at [facility] 2021)  Electrophysiologist: Dr. Johney Frame    Chief Complaint: CP  History of Present Illness: Jacqueline Fitzpatrick is a 75 y.o. female with history of HTN, DM, PPM  7//2021 she went to [facility] with recurrent  found to have episodes of advanced heart block w/Mobitz II  in the ER associated with symptoms and sent to [facility] underwent PPM implant.  She comes today to be seen for Dr. Johney Frame, last seen by him Oct 2021, noted vis device brief ATach correlated with some palpitations though only seconds. She was not device dependent No further syncope  Phone notes report via her grand daughter a request to be seen for CP  TODAY She comes accompanied by her granddaughter. They report:  Thursday evening last week she was bother all night with indigestion belching and bloating the next day ash she was cleaning her kitchen she developed a central chest heaviness seemed to radiate to both shoulders/arms and was quite uncomfortable.  She sat down to rest and it seemed to easy off though through out the whole day with a sense of this pressure/discomfort to her chest.  She was also nauseous and through the weekend every time she ate she was quite nauseous and on a few occasions vomited. Through the weekend this central chest discomfort persisted seemed worse when active. Monday seemed to be better and Tuesday she felt pretty good without any chest awareness, though remained nauseous and noted a cough as well.  She saw her PMD yesterday who suggested she may need a coronary stent and to see [place] and ordered a CXR, yesterday afternoon with abdominal bloating and persistent nausea then developed diarrhea feeling poorly all afteroon with this. Today she woke feeling well again without symptoms.  She has not had the chest discomfort in a  few days though the nausea/GI symptoms did not get better until today really  She has no rest SOB, does tend to get winded with ambulation though this is not new for her and not changing  No dizzy spells, near syncope or syncope, though the other day her grand daughter said she was a little lightheaded upon standing up    Device information SJM dual chamber PPM implanted 05/29/2020   Past Medical History:  Diagnosis Date   Complication of anesthesia    hard to wake up   Diabetes mellitus without complication (HCC)    Hypertension    Presence of permanent cardiac pacemaker    Second degree Mobitz II AV block    s/p PPM    Past Surgical History:  Procedure Laterality Date   CHOLECYSTECTOMY     PACEMAKER IMPLANT N/A 05/28/2020   Procedure: PACEMAKER IMPLANT;  Surgeon: Hillis Range, MD;  Location: [facility];  Service: Cardiovascular;  Laterality: N/A;   REPAIR EXTENSOR TENDON Right 12/12/2020   Procedure: RIGHT LONG FINGER EXTENSOR TENDON SUBLUXATION REPAIR;  Surgeon: Betha Loa, MD;  Location: [facility];  Service: Orthopedics;  Laterality: Right;    Current Outpatient Medications  Medication Sig Dispense Refill   aspirin EC 81 MG tablet Take 81 mg by mouth daily. Swallow whole.     Dulaglutide (TRULICITY) 0.75 MG/0.5ML SOPN Inject into the skin once a week.     FLUoxetine (PROZAC) 20 MG capsule Take 20 mg by mouth daily.     glipiZIDE (  GLUCOTROL) 10 MG tablet Take 10 mg by mouth 2 (two) times daily.     JARDIANCE 25 MG TABS tablet Take 25 mg by mouth daily.     lisinopril (ZESTRIL) 40 MG tablet Take 40 mg by mouth daily.     naproxen (NAPROSYN) 500 MG tablet Take 1 tablet by mouth daily.     Omega-3 Fatty Acids (FISH OIL PO) Take 1 capsule by mouth daily.     rOPINIRole (REQUIP) 1 MG tablet Take 1 mg by mouth 2 (two) times daily.     simvastatin (ZOCOR) 20 MG tablet Take 20 mg by mouth daily.     traZODone (DESYREL) 50 MG tablet Take 50 mg by mouth at  bedtime as needed for sleep.     No current facility-administered medications for this visit.    Allergies:   Metformin   Social History:  The patient  reports that she has been smoking. She has never used smokeless tobacco. She reports previous alcohol use. She reports that she does not use drugs.   Family History:   Her mom had unknown heart condition Her father had CABG  ROS:  Please see the history of present illness.    All other systems are reviewed and otherwise negative.   PHYSICAL EXAM:  VS:  BP 118/60   Pulse 74   Ht 5\' 2"  (1.575 m)   Wt 189 lb 6.4 oz (85.9 kg)   SpO2 94%   BMI 34.64 kg/m  BMI: Body mass index is 34.64 kg/m. Well nourished, well developed, in no acute distress HEENT: normocephalic, atraumatic Neck: no JVD, carotid bruits or masses Cardiac: RRR; no significant murmurs, no rubs, or gallops Lungs:  CTA b/l, no wheezing, rhonchi or rales Abd: soft, nontender MS: no deformity or atrophy Ext: no edema Skin: warm and dry, no rash Neuro:  No gross deficits appreciated Psych: euthymic mood, full affect  PPM site is stable, no tethering or discomfort   EKG:  Done today and reviewed by myself shows  SR 77bpm, marked low voltage V3-6 , her prior EKG the same, no acute looking changes  Device interrogation done today and reviewed by myself:  Battery and lead measurements are good No arrhythmias AP 8.3% VP <1%   05/28/2020: TTE IMPRESSIONS   1. Vigorous LV systolic function; grade 1 diastolic dysfunction; mildly  dilated ascending aorta.   2. Left ventricular ejection fraction, by estimation, is >75%. The left  ventricle has hyperdynamic function. The left ventricle has no regional  wall motion abnormalities. Left ventricular diastolic parameters are  consistent with Grade I diastolic  dysfunction (impaired relaxation).   3. Right ventricular systolic function is normal. The right ventricular  size is normal.   4. The mitral valve is normal in  structure. Trivial mitral valve  regurgitation. No evidence of mitral stenosis.   5. The aortic valve has an indeterminant number of cusps. Aortic valve  regurgitation is trivial. No aortic stenosis is present.   6. Aortic dilatation noted. There is mild dilatation of the ascending  aorta measuring 38 mm.   7. The inferior vena cava is normal in size with greater than 50%  respiratory variability, suggesting right atrial pressure of 3 mmHg.  Recent Labs: 05/28/2020: ALT 16; B Natriuretic Peptide 71.2; Hemoglobin 12.9; Platelets 259; TSH 2.224 12/10/2020: BUN 22; Creatinine, Ser 0.86; Potassium 4.5; Sodium 136  No results found for requested labs within last 8760 hours.   CrCl cannot be calculated (Patient's most recent lab result  is older than the maximum 21 days allowed.).   Wt Readings from Last 3 Encounters:  05/21/21 189 lb 6.4 oz (85.9 kg)  12/12/20 192 lb 7.4 oz (87.3 kg)  09/02/20 198 lb 12.8 oz (90.2 kg)     Other studies reviewed: Additional studies/records reviewed today include: summarized above  ASSESSMENT AND PLAN:  PPM Intact function, no programming changes made  2. HTN Looks good  3. CP I suspect GI etiology though will pursue ischemic eval Plan for lexiscan stress test, we discussed the procedure and potential risks/benefits Update her echo Discussed ER precautions  Disposition: F/u with in a month or so, sooner if needed.    Current medicines are reviewed at length with the patient today.  The patient did not have any concerns regarding medicines.  Norma Fredrickson, PA-C 05/21/2021 12:58 PM     [facility] [address] [place] [address] [phone] (office)  [phone] (fax)

## 2021-05-21 ENCOUNTER — Encounter: Payer: Self-pay | Admitting: Physician Assistant

## 2021-05-21 ENCOUNTER — Encounter (INDEPENDENT_AMBULATORY_CARE_PROVIDER_SITE_OTHER): Payer: Self-pay

## 2021-05-21 ENCOUNTER — Other Ambulatory Visit: Payer: Self-pay

## 2021-05-21 ENCOUNTER — Ambulatory Visit (INDEPENDENT_AMBULATORY_CARE_PROVIDER_SITE_OTHER): Payer: Medicare Other | Admitting: Physician Assistant

## 2021-05-21 ENCOUNTER — Encounter: Payer: Self-pay | Admitting: *Deleted

## 2021-05-21 VITALS — BP 118/60 | HR 74 | Ht 62.0 in | Wt 189.4 lb

## 2021-05-21 DIAGNOSIS — R079 Chest pain, unspecified: Secondary | ICD-10-CM

## 2021-05-21 DIAGNOSIS — I459 Conduction disorder, unspecified: Secondary | ICD-10-CM | POA: Diagnosis not present

## 2021-05-21 DIAGNOSIS — Z950 Presence of cardiac pacemaker: Secondary | ICD-10-CM

## 2021-05-21 DIAGNOSIS — I10 Essential (primary) hypertension: Secondary | ICD-10-CM | POA: Diagnosis not present

## 2021-05-21 LAB — CUP PACEART INCLINIC DEVICE CHECK
Battery Remaining Longevity: 136 mo
Battery Voltage: 3.02 V
Brady Statistic RA Percent Paced: 8.2 %
Brady Statistic RV Percent Paced: 0.19 %
Date Time Interrogation Session: 20220629132411
Implantable Lead Implant Date: 20210706
Implantable Lead Implant Date: 20210706
Implantable Lead Location: 753859
Implantable Lead Location: 753860
Implantable Pulse Generator Implant Date: 20210706
Lead Channel Impedance Value: 462.5 Ohm
Lead Channel Impedance Value: 512.5 Ohm
Lead Channel Pacing Threshold Amplitude: 0.5 V
Lead Channel Pacing Threshold Amplitude: 0.5 V
Lead Channel Pacing Threshold Amplitude: 0.625 V
Lead Channel Pacing Threshold Pulse Width: 0.4 ms
Lead Channel Pacing Threshold Pulse Width: 0.4 ms
Lead Channel Pacing Threshold Pulse Width: 0.4 ms
Lead Channel Sensing Intrinsic Amplitude: 3.5 mV
Lead Channel Sensing Intrinsic Amplitude: 4.3 mV
Lead Channel Setting Pacing Amplitude: 0.875
Lead Channel Setting Pacing Amplitude: 2 V
Lead Channel Setting Pacing Pulse Width: 0.4 ms
Lead Channel Setting Sensing Sensitivity: 2 mV
Pulse Gen Model: 2272
Pulse Gen Serial Number: 3847058

## 2021-05-21 NOTE — Patient Instructions (Addendum)
Medication Instructions:   Your physician recommends that you continue on your current medications as directed. Please refer to the Current Medication list given to you today.  *If you need a refill on your cardiac medications before your next appointment, please call your pharmacy*   Lab Work:  NONE ORDERED  TODAY   If you have labs (blood work) drawn today and your tests are completely normal, you will receive your results only by: MyChart Message (if you have MyChart) OR A paper copy in the mail If you have any lab test that is abnormal or we need to change your treatment, we will call you to review the results.   Testing/Procedures: Your physician has requested that you have an echocardiogram. Echocardiography is a painless test that uses sound waves to create images of your heart. It provides your doctor with information about the size and shape of your heart and how well your heart's chambers and valves are working. This procedure takes approximately one hour. There are no restrictions for this procedure.  Your physician has requested that you have a lexiscan myoview. For further information please visit [email or web address] Please follow instruction sheet, as given.   Follow-Up: At [facility], you and your health needs are our priority.  As part of our continuing mission to provide you with exceptional heart care, we have created designated Provider Care Teams.  These Care Teams include your primary Cardiologist (physician) and Advanced Practice Providers (APPs -  Physician Assistants and Nurse Practitioners) who all work together to provide you with the care you need, when you need it.  We recommend signing up for the patient portal called "MyChart".  Sign up information is provided on this After Visit Summary.  MyChart is used to connect with patients for Virtual Visits (Telemedicine).  Patients are able to view lab/test results, encounter notes, upcoming appointments, etc.   Non-urgent messages can be sent to your provider as well.   To learn more about what you can do with MyChart, go to [email or web address].    Your next appointment:   1 month(s)  The format for your next appointment:   In Person  Provider:   Casimiro Needle "Otilio Saber, PA-C   Other Instructions

## 2021-05-21 NOTE — Telephone Encounter (Signed)
Patient was seen today by APP.

## 2021-05-27 DIAGNOSIS — R079 Chest pain, unspecified: Secondary | ICD-10-CM | POA: Diagnosis not present

## 2021-05-27 DIAGNOSIS — R197 Diarrhea, unspecified: Secondary | ICD-10-CM | POA: Diagnosis not present

## 2021-05-27 DIAGNOSIS — Z6834 Body mass index (BMI) 34.0-34.9, adult: Secondary | ICD-10-CM | POA: Diagnosis not present

## 2021-05-27 DIAGNOSIS — M25519 Pain in unspecified shoulder: Secondary | ICD-10-CM | POA: Diagnosis not present

## 2021-05-28 ENCOUNTER — Other Ambulatory Visit (HOSPITAL_COMMUNITY): Payer: Medicare Other

## 2021-06-04 DIAGNOSIS — E1159 Type 2 diabetes mellitus with other circulatory complications: Secondary | ICD-10-CM | POA: Diagnosis not present

## 2021-06-04 DIAGNOSIS — E785 Hyperlipidemia, unspecified: Secondary | ICD-10-CM | POA: Diagnosis not present

## 2021-06-05 ENCOUNTER — Telehealth: Payer: Self-pay

## 2021-06-05 NOTE — Telephone Encounter (Signed)
Attempted to contact the patient, but her voicemail box isn't set up. Will try again later. S.Michale Weikel EMTP

## 2021-06-09 ENCOUNTER — Ambulatory Visit (INDEPENDENT_AMBULATORY_CARE_PROVIDER_SITE_OTHER): Payer: Medicare Other

## 2021-06-09 ENCOUNTER — Other Ambulatory Visit: Payer: Self-pay

## 2021-06-09 DIAGNOSIS — R079 Chest pain, unspecified: Secondary | ICD-10-CM | POA: Diagnosis not present

## 2021-06-09 DIAGNOSIS — Z950 Presence of cardiac pacemaker: Secondary | ICD-10-CM

## 2021-06-09 LAB — ECHOCARDIOGRAM COMPLETE
Area-P 1/2: 2.94 cm2
S' Lateral: 1.8 cm

## 2021-06-09 NOTE — Progress Notes (Signed)
Complete echocardiogram performed.  Jimmy Alyxandra Tenbrink RDCS, RVT  

## 2021-06-10 ENCOUNTER — Ambulatory Visit (HOSPITAL_COMMUNITY): Payer: Medicare Other | Attending: Cardiology

## 2021-06-10 DIAGNOSIS — R079 Chest pain, unspecified: Secondary | ICD-10-CM | POA: Diagnosis not present

## 2021-06-10 LAB — MYOCARDIAL PERFUSION IMAGING
LV dias vol: 51 mL (ref 46–106)
LV sys vol: 14 mL
Peak HR: 93 {beats}/min
Rest HR: 70 {beats}/min
SDS: 3
SRS: 0
SSS: 3
TID: 1.16

## 2021-06-10 MED ORDER — TECHNETIUM TC 99M TETROFOSMIN IV KIT
10.3000 | PACK | Freq: Once | INTRAVENOUS | Status: AC | PRN
  Administered 2021-06-10: 10.3 via INTRAVENOUS
  Filled 2021-06-10: qty 11

## 2021-06-10 MED ORDER — TECHNETIUM TC 99M TETROFOSMIN IV KIT
30.7000 | PACK | Freq: Once | INTRAVENOUS | Status: AC | PRN
  Administered 2021-06-10: 30.7 via INTRAVENOUS
  Filled 2021-06-10: qty 31

## 2021-06-10 MED ORDER — REGADENOSON 0.4 MG/5ML IV SOLN
0.4000 mg | Freq: Once | INTRAVENOUS | Status: AC
  Administered 2021-06-10: 0.4 mg via INTRAVENOUS

## 2021-06-11 ENCOUNTER — Other Ambulatory Visit: Payer: Self-pay

## 2021-06-11 ENCOUNTER — Other Ambulatory Visit: Payer: Self-pay | Admitting: Student

## 2021-06-11 MED ORDER — NITROGLYCERIN 0.4 MG SL SUBL: 0.4000 mg | SUBLINGUAL_TABLET | SUBLINGUAL | 3 refills | Status: AC | PRN

## 2021-06-13 DIAGNOSIS — I209 Angina pectoris, unspecified: Secondary | ICD-10-CM | POA: Diagnosis not present

## 2021-06-13 DIAGNOSIS — E1169 Type 2 diabetes mellitus with other specified complication: Secondary | ICD-10-CM | POA: Diagnosis not present

## 2021-06-13 DIAGNOSIS — N1831 Chronic kidney disease, stage 3a: Secondary | ICD-10-CM | POA: Diagnosis not present

## 2021-06-13 DIAGNOSIS — E782 Mixed hyperlipidemia: Secondary | ICD-10-CM | POA: Diagnosis not present

## 2021-06-16 ENCOUNTER — Ambulatory Visit (INDEPENDENT_AMBULATORY_CARE_PROVIDER_SITE_OTHER): Payer: Medicare Other

## 2021-06-16 DIAGNOSIS — I459 Conduction disorder, unspecified: Secondary | ICD-10-CM

## 2021-06-17 LAB — CUP PACEART REMOTE DEVICE CHECK
Battery Remaining Longevity: 125 mo
Battery Remaining Percentage: 95 %
Battery Voltage: 3.02 V
Brady Statistic AP VP Percent: 1 %
Brady Statistic AP VS Percent: 8.2 %
Brady Statistic AS VP Percent: 1 %
Brady Statistic AS VS Percent: 91 %
Brady Statistic RA Percent Paced: 7.7 %
Brady Statistic RV Percent Paced: 1 %
Date Time Interrogation Session: 20220724141310
Implantable Lead Implant Date: 20210706
Implantable Lead Implant Date: 20210706
Implantable Lead Location: 753859
Implantable Lead Location: 753860
Implantable Pulse Generator Implant Date: 20210706
Lead Channel Impedance Value: 430 Ohm
Lead Channel Impedance Value: 530 Ohm
Lead Channel Pacing Threshold Amplitude: 0.5 V
Lead Channel Pacing Threshold Amplitude: 0.875 V
Lead Channel Pacing Threshold Pulse Width: 0.4 ms
Lead Channel Pacing Threshold Pulse Width: 0.4 ms
Lead Channel Sensing Intrinsic Amplitude: 1.3 mV
Lead Channel Sensing Intrinsic Amplitude: 5 mV
Lead Channel Setting Pacing Amplitude: 1.125
Lead Channel Setting Pacing Amplitude: 2 V
Lead Channel Setting Pacing Pulse Width: 0.4 ms
Lead Channel Setting Sensing Sensitivity: 2 mV
Pulse Gen Model: 2272
Pulse Gen Serial Number: 3847058

## 2021-06-19 NOTE — Progress Notes (Signed)
Electrophysiology Office Note Date: 06/20/2021  ID:  Jacqueline Fitzpatrick, DOB 1946-01-09, MRN [id number]  PCP: Charlott Rakes, MD Primary Cardiologist: None Electrophysiologist: Hillis Range, MD   CC: Stress test follow-up  Jacqueline Fitzpatrick is a 75 y.o. female seen today for Hillis Range, MD for  follow up after a stress test .  Since last being seen in our clinic the patient reports doing better.  She hasn't had chest discomfort in over 4 days.  Again, she describes it as a chest pressure that comes on suddenly and weans off gradually, sometimes taking the whole day. She describes it as a pressure in the epigastric/left chest area, with some radiation into her neck (as an ache) and her shoulder (as pressure). When it first started the pressure was an 8, but was not as severe the last several times. She is adamant there is no relation to exertion or diet. Mostly occurs in late mornings early afternoons. No DOE, diaphoresis, syncope, or edema.   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 Symptomatic second degree AV block  Past Medical History:  Diagnosis Date   Complication of anesthesia    hard to wake up   Diabetes mellitus without complication (HCC)    Hypertension    Presence of permanent cardiac pacemaker    Second degree Mobitz II AV block    s/p PPM   Past Surgical History:  Procedure Laterality Date   CHOLECYSTECTOMY     PACEMAKER IMPLANT N/A 05/28/2020   Procedure: PACEMAKER IMPLANT;  Surgeon: Hillis Range, MD;  [facility];  Service: Cardiovascular;  Laterality: N/A;   REPAIR EXTENSOR TENDON Right 12/12/2020   Procedure: RIGHT LONG FINGER EXTENSOR TENDON SUBLUXATION REPAIR;  Surgeon: Betha Loa, MD;  Location: [facility];  Service: Orthopedics;  Laterality: Right;    Current Outpatient Medications  Medication Sig Dispense Refill   aspirin EC 81 MG tablet Take 81 mg by mouth daily. Swallow whole.     Dulaglutide  (TRULICITY) 0.75 MG/0.5ML SOPN Inject into the skin once a week.     FLUoxetine (PROZAC) 20 MG capsule Take 20 mg by mouth daily.     glipiZIDE (GLUCOTROL) 10 MG tablet Take 10 mg by mouth 2 (two) times daily.     JARDIANCE 25 MG TABS tablet Take 25 mg by mouth daily.     lisinopril (ZESTRIL) 40 MG tablet Take 40 mg by mouth daily.     naproxen (NAPROSYN) 500 MG tablet Take 1 tablet by mouth daily.     nitroGLYCERIN (NITROSTAT) 0.4 MG SL tablet Place 1 tablet (0.4 mg total) under the tongue every 5 (five) minutes as needed for chest pain. 25 tablet 3   Omega-3 Fatty Acids (FISH OIL PO) Take 1 capsule by mouth daily.     rOPINIRole (REQUIP) 1 MG tablet Take 1 mg by mouth 2 (two) times daily.     rosuvastatin (CRESTOR) 10 MG tablet Take 10 mg by mouth daily.     traZODone (DESYREL) 50 MG tablet Take 50 mg by mouth at bedtime as needed for sleep.     No current facility-administered medications for this visit.    Allergies:   Metformin   Social History: Social History   Socioeconomic History   Marital status: Widowed    Spouse name: Not on file   Number of children: Not on file   Years of education: Not on file   Highest education level: Not on file  Occupational  History   Not on file  Tobacco Use   Smoking status: Every Day   Smokeless tobacco: Never   Tobacco comments:    2-3 cigarettes per day  Substance and Sexual Activity   Alcohol use: Not Currently   Drug use: Never   Sexual activity: Not on file  Other Topics Concern   Not on file  Social History Narrative   Not on file   Social Determinants of Health   Financial Resource Strain: Not on file  Food Insecurity: Not on file  Transportation Needs: Not on file  Physical Activity: Not on file  Stress: Not on file  Social Connections: Not on file  Intimate Partner Violence: Not on file    Family History: No family history on file.   Review of Systems: All other systems reviewed and are otherwise negative except  as noted above.  Physical Exam: Vitals:   06/20/21 0817  BP: 116/68  Pulse: 81  SpO2: 99%  Weight: 192 lb 6.4 oz (87.3 kg)  Height: 5\' 2"  (1.575 m)     GEN- The patient is well appearing, alert and oriented x 3 today.   HEENT: normocephalic, atraumatic; sclera clear, conjunctiva pink; hearing intact; oropharynx clear; neck supple  Lungs- Clear to ausculation bilaterally, normal work of breathing.  No wheezes, rales, rhonchi Heart- Regular rate and rhythm, no murmurs, rubs or gallops  GI- soft, non-tender, non-distended, bowel sounds present  Extremities- no clubbing or cyanosis. No edema MS- no significant deformity or atrophy Skin- warm and dry, no rash or lesion; PPM pocket well healed Psych- euthymic mood, full affect Neuro- strength and sensation are intact  PPM Interrogation- reviewed in detail today,  See PACEART report  EKG:  EKG is not ordered today.  Recent Labs: 12/10/2020: BUN 22; Creatinine, Ser 0.86; Potassium 4.5; Sodium 136   Wt Readings from Last 3 Encounters:  06/20/21 192 lb 6.4 oz (87.3 kg)  06/10/21 189 lb (85.7 kg)  05/21/21 189 lb 6.4 oz (85.9 kg)     Other studies Reviewed: Additional studies/ records that were reviewed today include: Previous EP office notes, Previous remote checks, Most recent labwork.   Assessment and Plan:  1. Symptomatic second degree AV block s/p St. Jude PPM  Normal PPM function See Pace Art report No changes today  2. Chest discomfort With both typical and atypical features Myoview 06/10/2021 with medium defect of mild severity in the mid anteroseptal and apical septal location.  Discussed with Dr. 06/12/2021.  Per Dr. Eldridge Dace low risk, would manage medically at this time with low risk of having disease requiring intervention. If symptoms worsen would consider cath at that time.  Continue ASA Continue statin. Lipids next week as she is not fasting. .  Add low dose metoprol 12.5 at bedtime Recommended continued PCP  follow up and consideration of GI follow up if no improvement.  Discussed alarm symptoms such as chest discomfort brought on by exertion and not relieved by rest, or worsening frequency or severity of her current symptoms.   3. HTN Stable on current regimen.  Adding low dose BB as above.  Current medicines are reviewed at length with the patient today.   The patient does not have concerns regarding her medicines.  The following changes were made today:  Low dose toprol added  Labs/ tests ordered today include:  No orders of the defined types were placed in this encounter.   Disposition:   Follow up with Dr. Allred/EP APP in 6 months  from a device perspective. Referral to gen cards to establish in [place], ideally 4-6 week mark.   Fasting labs next week to optimize lipid management.    Dustin Flock, PA-C  06/20/2021 8:48 AM  [facility] [address] [place] [address] [phone] (office) [phone] (fax)

## 2021-06-20 ENCOUNTER — Other Ambulatory Visit: Payer: Self-pay

## 2021-06-20 ENCOUNTER — Ambulatory Visit (INDEPENDENT_AMBULATORY_CARE_PROVIDER_SITE_OTHER): Payer: Medicare Other | Admitting: Student

## 2021-06-20 ENCOUNTER — Encounter: Payer: Self-pay | Admitting: Student

## 2021-06-20 VITALS — BP 116/68 | HR 81 | Ht 62.0 in | Wt 192.4 lb

## 2021-06-20 DIAGNOSIS — Z950 Presence of cardiac pacemaker: Secondary | ICD-10-CM | POA: Diagnosis not present

## 2021-06-20 DIAGNOSIS — I459 Conduction disorder, unspecified: Secondary | ICD-10-CM | POA: Diagnosis not present

## 2021-06-20 DIAGNOSIS — I10 Essential (primary) hypertension: Secondary | ICD-10-CM | POA: Diagnosis not present

## 2021-06-20 DIAGNOSIS — R079 Chest pain, unspecified: Secondary | ICD-10-CM | POA: Diagnosis not present

## 2021-06-20 MED ORDER — METOPROLOL SUCCINATE ER 25 MG PO TB24: 12.5000 mg | ORAL_TABLET | Freq: Every day | ORAL | 3 refills | Status: DC

## 2021-06-20 NOTE — Patient Instructions (Signed)
Medication Instructions:  Your physician has recommended you make the following change in your medication:   START: Metoprolol Succinate 12.5mg  at bedtime  *If you need a refill on your cardiac medications before your next appointment, please call your pharmacy*   Lab Work: Next Week: BMET, CBC, FLP (you will need to be fasting for these labs)  If you have labs (blood work) drawn today and your tests are completely normal, you will receive your results only by: MyChart Message (if you have MyChart) OR A paper copy in the mail If you have any lab test that is abnormal or we need to change your treatment, we will call you to review the results.   Follow-Up: At [facility], you and your health needs are our priority.  As part of our continuing mission to provide you with exceptional heart care, we have created designated Provider Care Teams.  These Care Teams include your primary Cardiologist (physician) and Advanced Practice Providers (APPs -  Physician Assistants and Nurse Practitioners) who all work together to provide you with the care you need, when you need it.  We recommend signing up for the patient portal called "MyChart".  Sign up information is provided on this After Visit Summary.  MyChart is used to connect with patients for Virtual Visits (Telemedicine).  Patients are able to view lab/test results, encounter notes, upcoming appointments, etc.  Non-urgent messages can be sent to your provider as well.   To learn more about what you can do with MyChart, go to [email or web address].    Your next appointment:   6 month(s) 4-6 weeks with General Cardiologist in [place]  The format for your next appointment:   In Person  Provider:   You may see Hillis Range, MD or one of the following Advanced Practice Providers on your designated Care Team:   Francis Dowse, [place] Casimiro Needle "[facility]" [place], [place]

## 2021-06-24 ENCOUNTER — Ambulatory Visit: Payer: Medicare Other | Admitting: Student

## 2021-07-11 NOTE — Progress Notes (Signed)
Remote pacemaker transmission.   

## 2021-08-13 DIAGNOSIS — I459 Conduction disorder, unspecified: Secondary | ICD-10-CM | POA: Diagnosis not present

## 2021-08-13 DIAGNOSIS — I10 Essential (primary) hypertension: Secondary | ICD-10-CM | POA: Diagnosis not present

## 2021-08-13 DIAGNOSIS — R079 Chest pain, unspecified: Secondary | ICD-10-CM | POA: Diagnosis not present

## 2021-08-13 DIAGNOSIS — Z950 Presence of cardiac pacemaker: Secondary | ICD-10-CM | POA: Diagnosis not present

## 2021-08-14 LAB — BASIC METABOLIC PANEL
BUN/Creatinine Ratio: 23 (ref 12–28)
BUN: 20 mg/dL (ref 8–27)
CO2: 21 mmol/L (ref 20–29)
Calcium: 8.8 mg/dL (ref 8.7–10.3)
Chloride: 103 mmol/L (ref 96–106)
Creatinine, Ser: 0.86 mg/dL (ref 0.57–1.00)
Glucose: 103 mg/dL — ABNORMAL HIGH (ref 65–99)
Potassium: 4.4 mmol/L (ref 3.5–5.2)
Sodium: 137 mmol/L (ref 134–144)
eGFR: 70 mL/min/{1.73_m2} (ref >59)

## 2021-08-14 LAB — CBC
Hematocrit: 39.8 % (ref 34.0–46.6)
Hemoglobin: 13.5 g/dL (ref 11.1–15.9)
MCH: 31.3 pg (ref 26.6–33.0)
MCHC: 33.9 g/dL (ref 31.5–35.7)
MCV: 92 fL (ref 79–97)
Platelets: 264 10*3/uL (ref 150–450)
RBC: 4.32 x10E6/uL (ref 3.77–5.28)
RDW: 12.3 % (ref 11.7–15.4)
WBC: 6.5 10*3/uL (ref 3.4–10.8)

## 2021-08-14 LAB — LIPID PANEL
Chol/HDL Ratio: 2.2 ratio (ref 0.0–4.4)
Cholesterol, Total: 121 mg/dL (ref 100–199)
HDL: 56 mg/dL (ref >39)
LDL Chol Calc (NIH): 47 mg/dL (ref 0–99)
Triglycerides: 98 mg/dL (ref 0–149)
VLDL Cholesterol Cal: 18 mg/dL (ref 5–40)

## 2021-08-29 DIAGNOSIS — M25561 Pain in right knee: Secondary | ICD-10-CM | POA: Diagnosis not present

## 2021-08-29 DIAGNOSIS — Z6835 Body mass index (BMI) 35.0-35.9, adult: Secondary | ICD-10-CM | POA: Diagnosis not present

## 2021-08-29 DIAGNOSIS — Z23 Encounter for immunization: Secondary | ICD-10-CM | POA: Diagnosis not present

## 2021-09-10 DIAGNOSIS — M1711 Unilateral primary osteoarthritis, right knee: Secondary | ICD-10-CM | POA: Diagnosis not present

## 2021-09-10 DIAGNOSIS — M25461 Effusion, right knee: Secondary | ICD-10-CM | POA: Diagnosis not present

## 2021-09-10 DIAGNOSIS — M25561 Pain in right knee: Secondary | ICD-10-CM | POA: Diagnosis not present

## 2021-09-15 ENCOUNTER — Ambulatory Visit: Payer: Medicare Other

## 2021-09-19 DIAGNOSIS — Z136 Encounter for screening for cardiovascular disorders: Secondary | ICD-10-CM | POA: Diagnosis not present

## 2021-09-19 DIAGNOSIS — Z1331 Encounter for screening for depression: Secondary | ICD-10-CM | POA: Diagnosis not present

## 2021-09-19 DIAGNOSIS — Z0000 Encounter for general adult medical examination without abnormal findings: Secondary | ICD-10-CM | POA: Diagnosis not present

## 2021-09-19 DIAGNOSIS — Z720 Tobacco use: Secondary | ICD-10-CM | POA: Diagnosis not present

## 2021-09-19 DIAGNOSIS — F17210 Nicotine dependence, cigarettes, uncomplicated: Secondary | ICD-10-CM | POA: Diagnosis not present

## 2021-09-19 DIAGNOSIS — Z139 Encounter for screening, unspecified: Secondary | ICD-10-CM | POA: Diagnosis not present

## 2021-09-19 DIAGNOSIS — Z6835 Body mass index (BMI) 35.0-35.9, adult: Secondary | ICD-10-CM | POA: Diagnosis not present

## 2021-09-19 DIAGNOSIS — M1711 Unilateral primary osteoarthritis, right knee: Secondary | ICD-10-CM | POA: Diagnosis not present

## 2021-10-23 DIAGNOSIS — H40003 Preglaucoma, unspecified, bilateral: Secondary | ICD-10-CM | POA: Diagnosis not present

## 2021-10-23 DIAGNOSIS — H524 Presbyopia: Secondary | ICD-10-CM | POA: Diagnosis not present

## 2021-10-23 DIAGNOSIS — H5203 Hypermetropia, bilateral: Secondary | ICD-10-CM | POA: Diagnosis not present

## 2021-10-23 DIAGNOSIS — Z794 Long term (current) use of insulin: Secondary | ICD-10-CM | POA: Diagnosis not present

## 2021-10-23 DIAGNOSIS — Z961 Presence of intraocular lens: Secondary | ICD-10-CM | POA: Diagnosis not present

## 2021-10-23 DIAGNOSIS — I10 Essential (primary) hypertension: Secondary | ICD-10-CM | POA: Diagnosis not present

## 2021-10-23 DIAGNOSIS — E119 Type 2 diabetes mellitus without complications: Secondary | ICD-10-CM | POA: Diagnosis not present

## 2021-10-23 DIAGNOSIS — H47233 Glaucomatous optic atrophy, bilateral: Secondary | ICD-10-CM | POA: Diagnosis not present

## 2021-10-23 DIAGNOSIS — H40013 Open angle with borderline findings, low risk, bilateral: Secondary | ICD-10-CM | POA: Diagnosis not present

## 2021-10-23 DIAGNOSIS — H35372 Puckering of macula, left eye: Secondary | ICD-10-CM | POA: Diagnosis not present

## 2021-10-23 DIAGNOSIS — H52223 Regular astigmatism, bilateral: Secondary | ICD-10-CM | POA: Diagnosis not present

## 2021-10-23 DIAGNOSIS — Z9849 Cataract extraction status, unspecified eye: Secondary | ICD-10-CM | POA: Diagnosis not present

## 2021-12-15 ENCOUNTER — Ambulatory Visit (INDEPENDENT_AMBULATORY_CARE_PROVIDER_SITE_OTHER): Payer: Medicare Other

## 2021-12-15 DIAGNOSIS — I459 Conduction disorder, unspecified: Secondary | ICD-10-CM

## 2021-12-15 DIAGNOSIS — E1169 Type 2 diabetes mellitus with other specified complication: Secondary | ICD-10-CM | POA: Diagnosis not present

## 2021-12-15 LAB — CUP PACEART REMOTE DEVICE CHECK
Battery Remaining Longevity: 120 mo
Battery Remaining Percentage: 91 %
Battery Voltage: 3.02 V
Brady Statistic AP VP Percent: 1 %
Brady Statistic AP VS Percent: 12 %
Brady Statistic AS VP Percent: 1 %
Brady Statistic AS VS Percent: 86 %
Brady Statistic RA Percent Paced: 12 %
Brady Statistic RV Percent Paced: 1 %
Date Time Interrogation Session: 20230123023941
Implantable Lead Implant Date: 20210706
Implantable Lead Implant Date: 20210706
Implantable Lead Location: 753859
Implantable Lead Location: 753860
Implantable Pulse Generator Implant Date: 20210706
Lead Channel Impedance Value: 440 Ohm
Lead Channel Impedance Value: 530 Ohm
Lead Channel Pacing Threshold Amplitude: 0.5 V
Lead Channel Pacing Threshold Amplitude: 0.875 V
Lead Channel Pacing Threshold Pulse Width: 0.4 ms
Lead Channel Pacing Threshold Pulse Width: 0.4 ms
Lead Channel Sensing Intrinsic Amplitude: 2.2 mV
Lead Channel Sensing Intrinsic Amplitude: 4.7 mV
Lead Channel Setting Pacing Amplitude: 1.125
Lead Channel Setting Pacing Amplitude: 2 V
Lead Channel Setting Pacing Pulse Width: 0.4 ms
Lead Channel Setting Sensing Sensitivity: 2 mV
Pulse Gen Model: 2272
Pulse Gen Serial Number: 3847058

## 2021-12-22 DIAGNOSIS — R21 Rash and other nonspecific skin eruption: Secondary | ICD-10-CM | POA: Diagnosis not present

## 2021-12-22 DIAGNOSIS — Z6835 Body mass index (BMI) 35.0-35.9, adult: Secondary | ICD-10-CM | POA: Diagnosis not present

## 2021-12-22 DIAGNOSIS — R232 Flushing: Secondary | ICD-10-CM | POA: Diagnosis not present

## 2021-12-22 NOTE — Progress Notes (Signed)
Electrophysiology Office Note Date: 12/23/2021  ID:  Jacqueline Fitzpatrick, Jacqueline Fitzpatrick [birth year], MRN [id number]  PCP: Charlott Rakes, MD Primary Cardiologist: None Electrophysiologist: Hillis Range, MD   CC: Pacemaker follow-up  Jacqueline Fitzpatrick is a 76 y.o. female seen today for Hillis Range, MD for routine electrophysiology followup.  Since last being seen in our clinic the patient reports doing very well.  she denies chest pain, palpitations, dyspnea, PND, orthopnea, nausea, vomiting, dizziness, syncope, edema, weight gain, or early satiety.  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 Symptomatic second degree AV block  Past Medical History:  Diagnosis Date   Complication of anesthesia    hard to wake up   Diabetes mellitus without complication (HCC)    Hypertension    Presence of permanent cardiac pacemaker    Second degree Mobitz II AV block    s/p PPM   Past Surgical History:  Procedure Laterality Date   CHOLECYSTECTOMY     PACEMAKER IMPLANT N/A 05/28/2020   Procedure: PACEMAKER IMPLANT;  Surgeon: Hillis Range, MD;  [facility];  Service: Cardiovascular;  Laterality: N/A;   REPAIR EXTENSOR TENDON Right 12/12/2020   Procedure: RIGHT LONG FINGER EXTENSOR TENDON SUBLUXATION REPAIR;  Surgeon: Betha Loa, MD;  Location: [facility];  Service: Orthopedics;  Laterality: Right;    Current Outpatient Medications  Medication Sig Dispense Refill   celecoxib (CELEBREX) 200 MG capsule Take 200 mg by mouth daily.     Dulaglutide (TRULICITY) 0.75 MG/0.5ML SOPN Inject into the skin once a week.     FLUoxetine (PROZAC) 20 MG capsule Take 20 mg by mouth daily.     glipiZIDE (GLUCOTROL) 10 MG tablet Take 10 mg by mouth 2 (two) times daily.     JARDIANCE 25 MG TABS tablet Take 25 mg by mouth daily.     lisinopril (ZESTRIL) 40 MG tablet Take 40 mg by mouth daily.     metoprolol succinate (TOPROL XL) 25 MG 24 hr tablet Take 0.5 tablets (12.5 mg  total) by mouth at bedtime. 45 tablet 3   naproxen (NAPROSYN) 500 MG tablet Take 1 tablet by mouth daily.     Omega-3 Fatty Acids (FISH OIL PO) Take 1 capsule by mouth daily.     rOPINIRole (REQUIP) 1 MG tablet Take 1 mg by mouth 2 (two) times daily.     rosuvastatin (CRESTOR) 10 MG tablet Take 10 mg by mouth daily.     traZODone (DESYREL) 50 MG tablet Take 50 mg by mouth at bedtime as needed for sleep.     nitroGLYCERIN (NITROSTAT) 0.4 MG SL tablet Place 1 tablet (0.4 mg total) under the tongue every 5 (five) minutes as needed for chest pain. 25 tablet 3   No current facility-administered medications for this visit.    Allergies:   Metformin   Social History: Social History   Socioeconomic History   Marital status: Widowed    Spouse name: Not on file   Number of children: Not on file   Years of education: Not on file   Highest education level: Not on file  Occupational History   Not on file  Tobacco Use   Smoking status: Every Day   Smokeless tobacco: Never   Tobacco comments:    2-3 cigarettes per day  Substance and Sexual Activity   Alcohol use: Not Currently   Drug use: Never   Sexual activity: Not on file  Other Topics Concern   Not on file  Social History Narrative   Not on file   Social Determinants of Health   Financial Resource Strain: Not on file  Food Insecurity: Not on file  Transportation Needs: Not on file  Physical Activity: Not on file  Stress: Not on file  Social Connections: Not on file  Intimate Partner Violence: Not on file    Family History: History reviewed. No pertinent family history.   Review of Systems: All other systems reviewed and are otherwise negative except as noted above.  Physical Exam: Vitals:   12/23/21 1153  BP: 114/64  Pulse: 71  SpO2: 94%  Weight: 196 lb 9.6 oz (89.2 kg)  Height: 5\' 2"  (1.575 m)     GEN- The patient is well appearing, alert and oriented x 3 today.   HEENT: normocephalic, atraumatic; sclera  clear, conjunctiva pink; hearing intact; oropharynx clear; neck supple  Lungs- Clear to ausculation bilaterally, normal work of breathing.  No wheezes, rales, rhonchi Heart- Regular rate and rhythm, no murmurs, rubs or gallops  GI- soft, non-tender, non-distended, bowel sounds present  Extremities- no clubbing or cyanosis. No edema MS- no significant deformity or atrophy Skin- warm and dry, no rash or lesion; PPM pocket well healed Psych- euthymic mood, full affect Neuro- strength and sensation are intact  PPM Interrogation- reviewed in detail today,  See PACEART report  EKG:  EKG is ordered today. Personal review of ekg ordered today shows NSR 71 bpm   Recent Labs: 08/13/2021: BUN 20; Creatinine, Ser 0.86; Hemoglobin 13.5; Platelets 264; Potassium 4.4; Sodium 137   Wt Readings from Last 3 Encounters:  12/23/21 196 lb 9.6 oz (89.2 kg)  06/20/21 192 lb 6.4 oz (87.3 kg)  06/10/21 189 lb (85.7 kg)     Other studies Reviewed: Additional studies/ records that were reviewed today include: Previous EP office notes, Previous remote checks, Most recent labwork.   Assessment and Plan:  1. Symptomatic bradycardia s/p St. Jude PPM  Normal PPM function See Pace Art report No changes today  2. Chest discomfort With both typical and atypical features Myoview 06/10/2021 with medium defect of mild severity in the mid anteroseptal and apical septal location.  Discussed with Dr. 06/12/2021.  Per Dr. Eldridge Dace low risk, would manage medically at this time with low risk of having disease requiring intervention. If symptoms worsen would consider cath at that time.  Continue ASA Continue statin.  Continue low dose metoprol 12.5 at bedtime Have discussed alarm symptoms such as chest discomfort brought on by exertion and not relieved by rest, or worsening frequency or severity of her current symptoms.    3. HTN Stable on current regimen   Current medicines are reviewed at length with the patient  today.     Disposition:   Follow up with Dr. Eldridge Dace in [place] 6 months to establish.    [place], PA-C  12/23/2021 12:15 PM  [facility] [address] [place] [phone] (office) [phone] (fax)

## 2021-12-23 ENCOUNTER — Encounter: Payer: Self-pay | Admitting: Student

## 2021-12-23 ENCOUNTER — Ambulatory Visit (INDEPENDENT_AMBULATORY_CARE_PROVIDER_SITE_OTHER): Payer: Medicare Other | Admitting: Student

## 2021-12-23 ENCOUNTER — Other Ambulatory Visit: Payer: Self-pay

## 2021-12-23 VITALS — BP 114/64 | HR 71 | Ht 62.0 in | Wt 196.6 lb

## 2021-12-23 DIAGNOSIS — R079 Chest pain, unspecified: Secondary | ICD-10-CM

## 2021-12-23 DIAGNOSIS — I10 Essential (primary) hypertension: Secondary | ICD-10-CM | POA: Diagnosis not present

## 2021-12-23 DIAGNOSIS — I459 Conduction disorder, unspecified: Secondary | ICD-10-CM

## 2021-12-23 DIAGNOSIS — R232 Flushing: Secondary | ICD-10-CM | POA: Diagnosis not present

## 2021-12-23 LAB — CUP PACEART INCLINIC DEVICE CHECK
Battery Remaining Longevity: 128 mo
Battery Voltage: 3.02 V
Brady Statistic RA Percent Paced: 13 %
Brady Statistic RV Percent Paced: 0.99 %
Date Time Interrogation Session: 20230131125659
Implantable Lead Implant Date: 20210706
Implantable Lead Implant Date: 20210706
Implantable Lead Location: 753859
Implantable Lead Location: 753860
Implantable Pulse Generator Implant Date: 20210706
Lead Channel Impedance Value: 425 Ohm
Lead Channel Impedance Value: 537.5 Ohm
Lead Channel Pacing Threshold Amplitude: 0.5 V
Lead Channel Pacing Threshold Amplitude: 0.5 V
Lead Channel Pacing Threshold Amplitude: 0.625 V
Lead Channel Pacing Threshold Pulse Width: 0.4 ms
Lead Channel Pacing Threshold Pulse Width: 0.4 ms
Lead Channel Pacing Threshold Pulse Width: 0.4 ms
Lead Channel Sensing Intrinsic Amplitude: 4.2 mV
Lead Channel Sensing Intrinsic Amplitude: 5 mV
Lead Channel Setting Pacing Amplitude: 0.875
Lead Channel Setting Pacing Amplitude: 2 V
Lead Channel Setting Pacing Pulse Width: 0.4 ms
Lead Channel Setting Sensing Sensitivity: 2 mV
Pulse Gen Model: 2272
Pulse Gen Serial Number: 3847058

## 2021-12-23 NOTE — Patient Instructions (Signed)
Medication Instructions:  Your physician recommends that you continue on your current medications as directed. Please refer to the Current Medication list given to you today.  *If you need a refill on your cardiac medications before your next appointment, please call your pharmacy*   Lab Work: None  If you have labs (blood work) drawn today and your tests are completely normal, you will receive your results only by: MyChart Message (if you have MyChart) OR A paper copy in the mail If you have any lab test that is abnormal or we need to change your treatment, we will call you to review the results.  Follow-Up: At [facility], you and your health needs are our priority.  As part of our continuing mission to provide you with exceptional heart care, we have created designated Provider Care Teams.  These Care Teams include your primary Cardiologist (physician) and Advanced Practice Providers (APPs -  Physician Assistants and Nurse Practitioners) who all work together to provide you with the care you need, when you need it.  We recommend signing up for the patient portal called "MyChart".  Sign up information is provided on this After Visit Summary.  MyChart is used to connect with patients for Virtual Visits (Telemedicine).  Patients are able to view lab/test results, encounter notes, upcoming appointments, etc.  Non-urgent messages can be sent to your provider as well.   To learn more about what you can do with MyChart, go to [email or web address].    Your next appointment:   6 month(s)  The format for your next appointment:   In Person  Provider:   Will Camnitz, MD{   

## 2021-12-26 NOTE — Progress Notes (Signed)
Remote pacemaker transmission.   

## 2022-01-14 DIAGNOSIS — Z6835 Body mass index (BMI) 35.0-35.9, adult: Secondary | ICD-10-CM | POA: Diagnosis not present

## 2022-01-14 DIAGNOSIS — N951 Menopausal and female climacteric states: Secondary | ICD-10-CM | POA: Diagnosis not present

## 2022-01-17 IMAGING — DX DG CHEST 2V
2 series · 2 of 2 positions shown · non-contrast
Comparison: 05/27/2020 and earlier.

CLINICAL DATA: Postop day 1 LEFT subclavian transvenous pacemaker
placement.

EXAM:
CHEST - 2 VIEW

[chest pa]
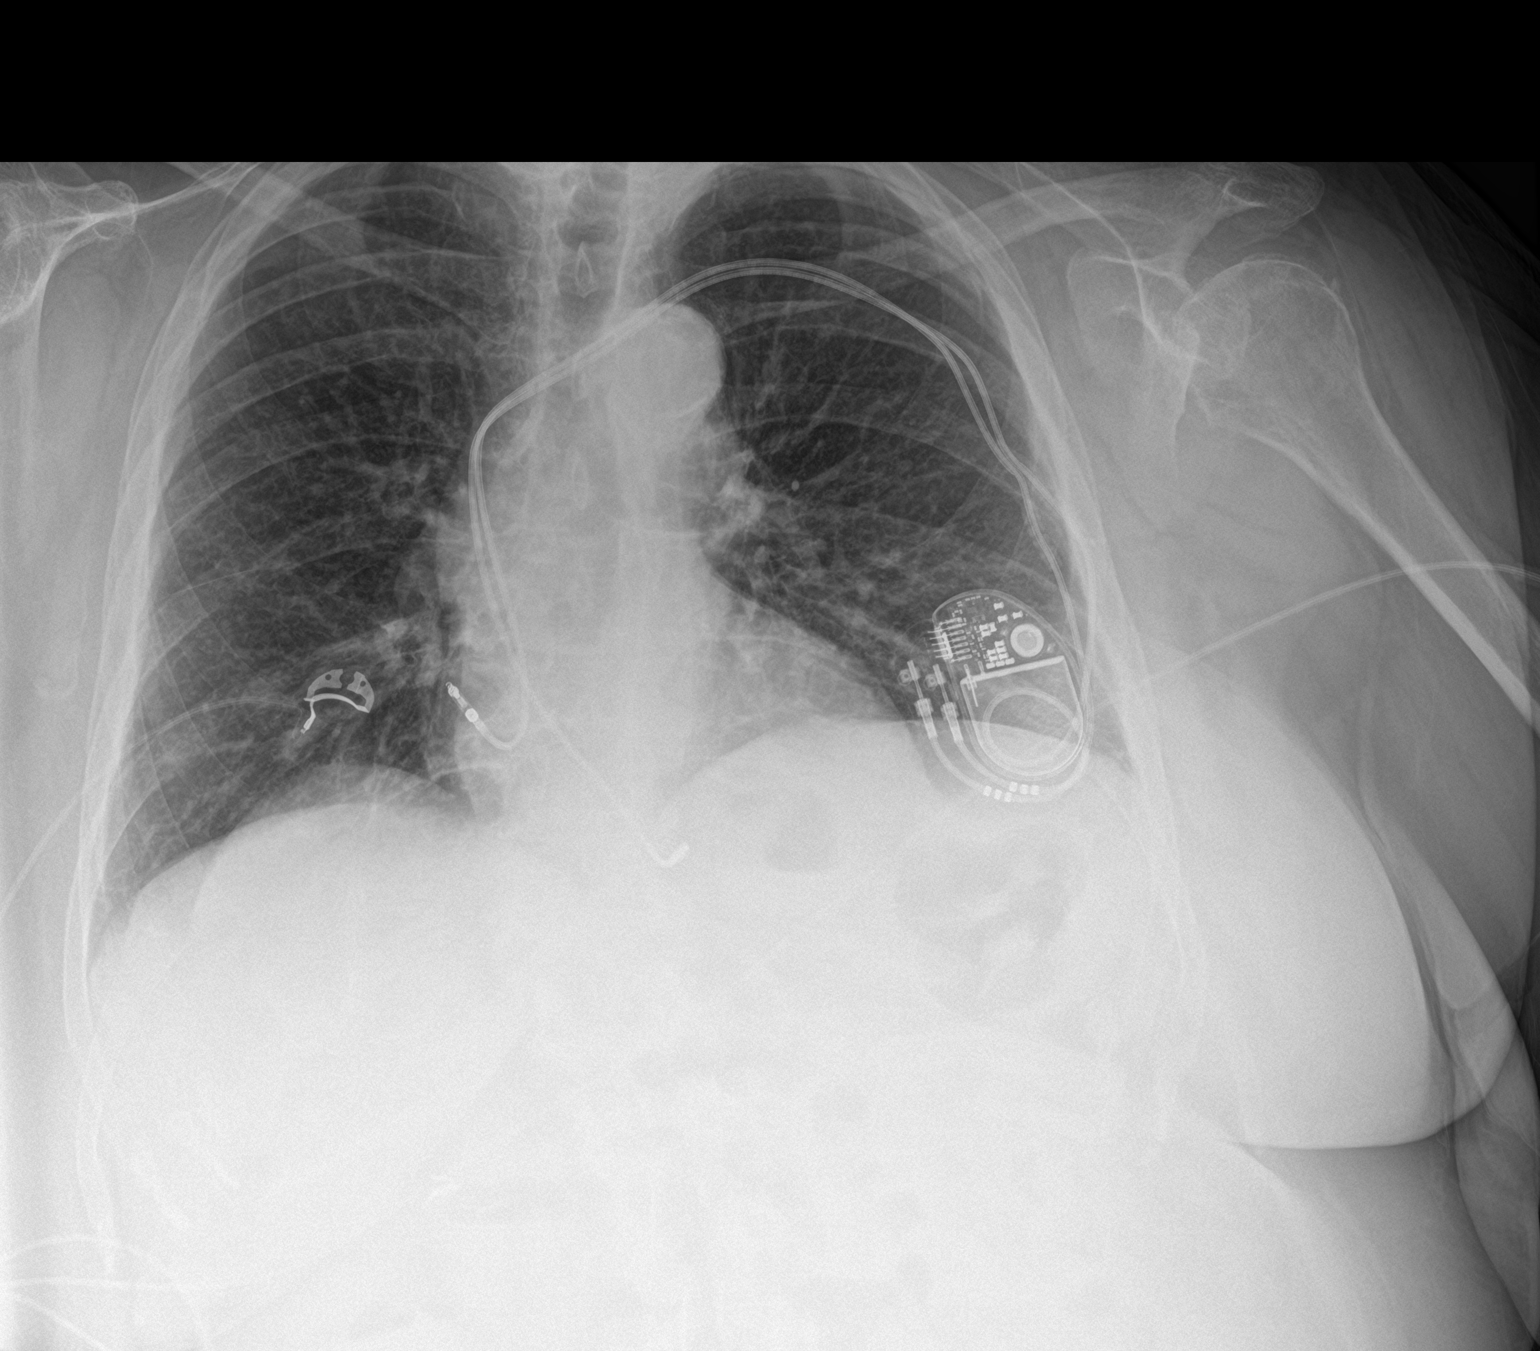

[chest lat]
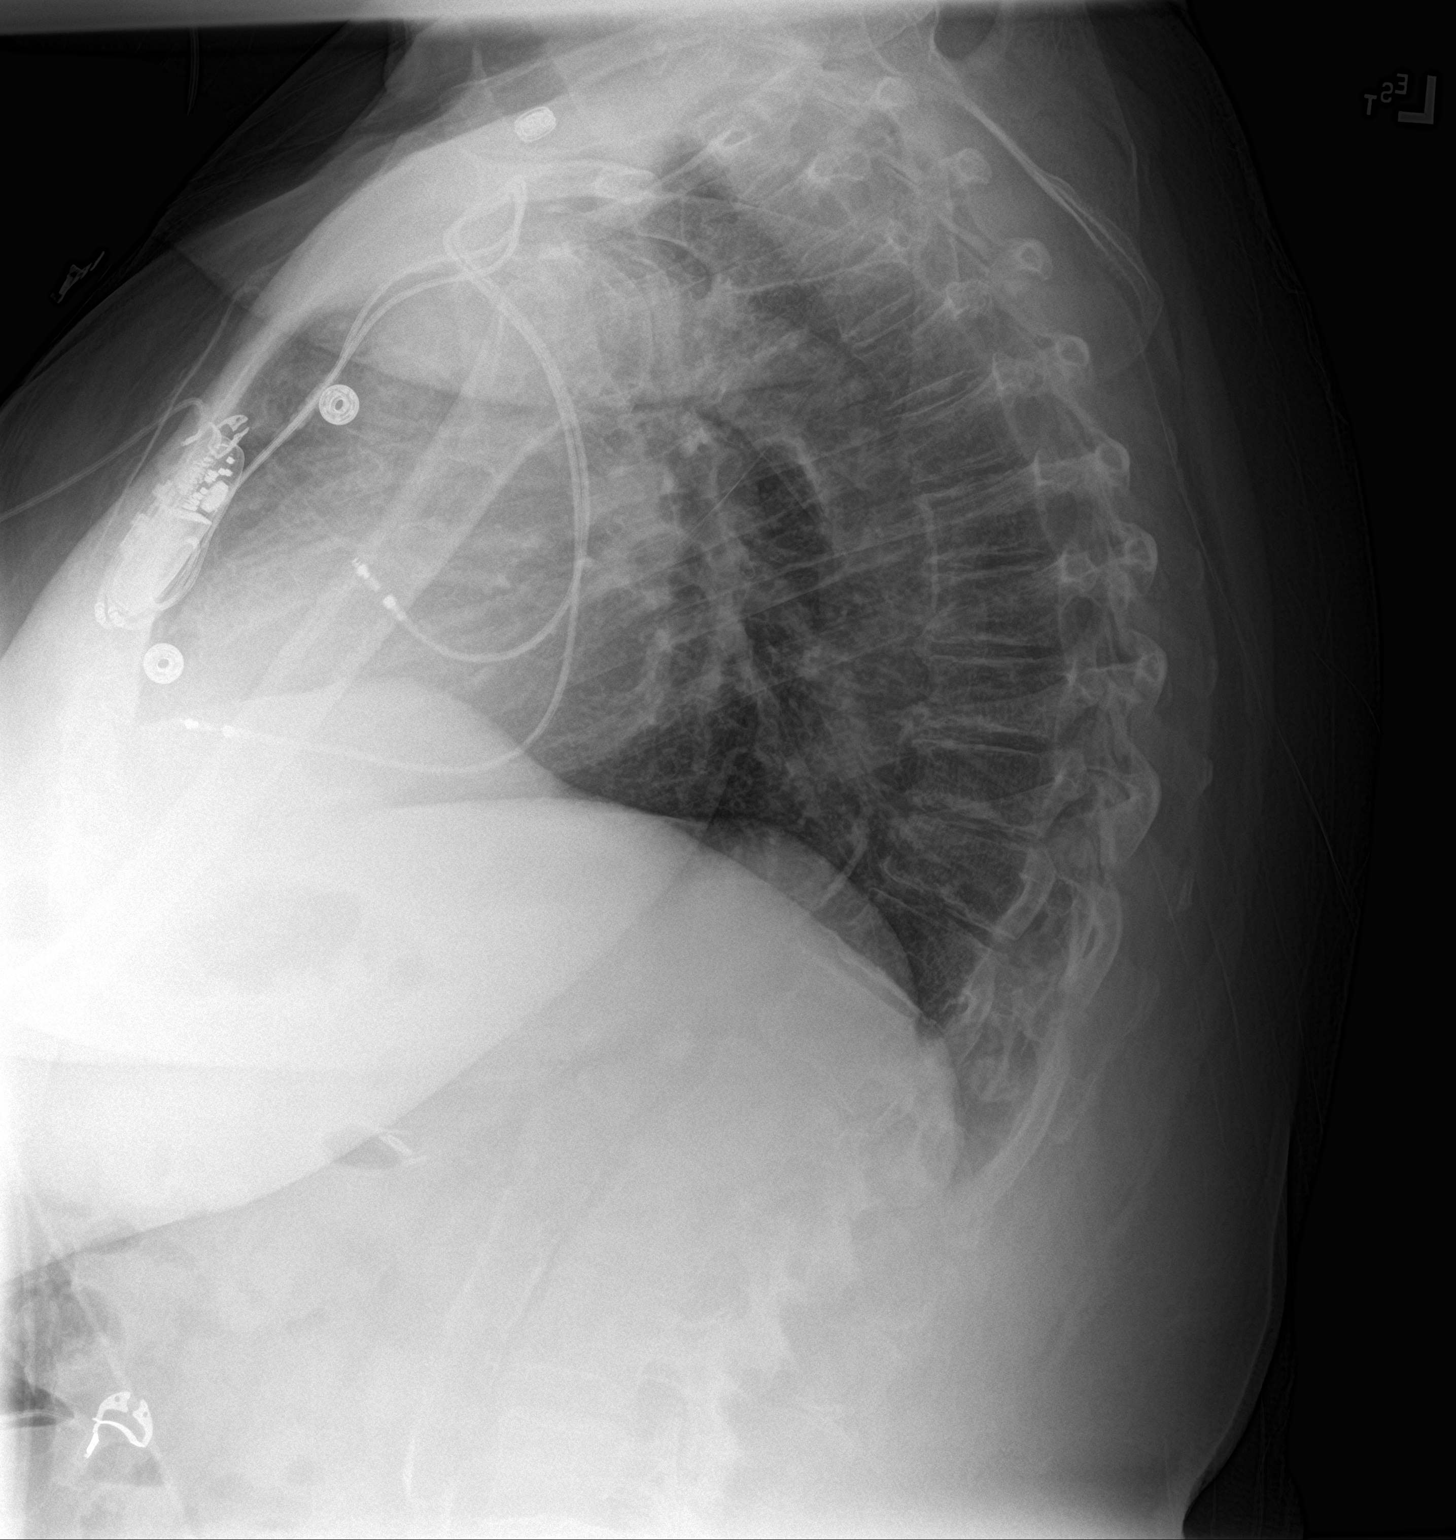

[2 of 2 positions shown; findings below may reference images not displayed]

FINDINGS: LEFT subclavian dual lead transvenous pacemaker appropriately
positioned with the lead tips at the expected location of the RIGHT
atrial appendage and RIGHT ventricular apex. No evidence of
pneumothorax or mediastinal hematoma.

Cardiac silhouette normal in size, unchanged. Suboptimal inspiration
accounts for crowded bronchovascular markings, especially in the
bases. Taking this into account, lungs clear. Pulmonary vascularity
normal.
IMPRESSION: 1. LEFT subclavian dual lead transvenous pacemaker appropriately
positioned without complicating features.
2. Suboptimal inspiration. No acute cardiopulmonary disease.

## 2022-03-16 ENCOUNTER — Ambulatory Visit (INDEPENDENT_AMBULATORY_CARE_PROVIDER_SITE_OTHER): Payer: Medicare Other

## 2022-03-16 DIAGNOSIS — I459 Conduction disorder, unspecified: Secondary | ICD-10-CM | POA: Diagnosis not present

## 2022-03-17 LAB — CUP PACEART REMOTE DEVICE CHECK
Battery Remaining Longevity: 117 mo
Battery Remaining Percentage: 89 %
Battery Voltage: 3.02 V
Brady Statistic AP VP Percent: 1 %
Brady Statistic AP VS Percent: 13 %
Brady Statistic AS VP Percent: 1 %
Brady Statistic AS VS Percent: 86 %
Brady Statistic RA Percent Paced: 12 %
Brady Statistic RV Percent Paced: 1 %
Date Time Interrogation Session: 20230424020015
Implantable Lead Implant Date: 20210706
Implantable Lead Implant Date: 20210706
Implantable Lead Location: 753859
Implantable Lead Location: 753860
Implantable Pulse Generator Implant Date: 20210706
Lead Channel Impedance Value: 460 Ohm
Lead Channel Impedance Value: 530 Ohm
Lead Channel Pacing Threshold Amplitude: 0.5 V
Lead Channel Pacing Threshold Amplitude: 0.875 V
Lead Channel Pacing Threshold Pulse Width: 0.4 ms
Lead Channel Pacing Threshold Pulse Width: 0.4 ms
Lead Channel Sensing Intrinsic Amplitude: 1.5 mV
Lead Channel Sensing Intrinsic Amplitude: 3.7 mV
Lead Channel Setting Pacing Amplitude: 1.125
Lead Channel Setting Pacing Amplitude: 2 V
Lead Channel Setting Pacing Pulse Width: 0.4 ms
Lead Channel Setting Sensing Sensitivity: 2 mV
Pulse Gen Model: 2272
Pulse Gen Serial Number: 3847058

## 2022-04-01 NOTE — Progress Notes (Signed)
Remote pacemaker transmission.   

## 2022-04-29 DIAGNOSIS — E1169 Type 2 diabetes mellitus with other specified complication: Secondary | ICD-10-CM | POA: Diagnosis not present

## 2022-05-08 DIAGNOSIS — N1831 Chronic kidney disease, stage 3a: Secondary | ICD-10-CM | POA: Diagnosis not present

## 2022-05-08 DIAGNOSIS — E1159 Type 2 diabetes mellitus with other circulatory complications: Secondary | ICD-10-CM | POA: Diagnosis not present

## 2022-05-08 DIAGNOSIS — E1151 Type 2 diabetes mellitus with diabetic peripheral angiopathy without gangrene: Secondary | ICD-10-CM | POA: Diagnosis not present

## 2022-05-08 DIAGNOSIS — I152 Hypertension secondary to endocrine disorders: Secondary | ICD-10-CM | POA: Diagnosis not present

## 2022-05-08 DIAGNOSIS — Z6834 Body mass index (BMI) 34.0-34.9, adult: Secondary | ICD-10-CM | POA: Diagnosis not present

## 2022-05-08 DIAGNOSIS — E785 Hyperlipidemia, unspecified: Secondary | ICD-10-CM | POA: Diagnosis not present

## 2022-05-30 ENCOUNTER — Other Ambulatory Visit: Payer: Self-pay | Admitting: Student

## 2022-06-01 ENCOUNTER — Other Ambulatory Visit: Payer: Self-pay

## 2022-06-01 MED ORDER — METOPROLOL SUCCINATE ER 25 MG PO TB24: 12.5000 mg | ORAL_TABLET | Freq: Every day | ORAL | 1 refills | Status: DC

## 2022-06-15 ENCOUNTER — Ambulatory Visit (INDEPENDENT_AMBULATORY_CARE_PROVIDER_SITE_OTHER): Payer: Medicare Other

## 2022-06-15 DIAGNOSIS — I459 Conduction disorder, unspecified: Secondary | ICD-10-CM

## 2022-06-15 LAB — CUP PACEART REMOTE DEVICE CHECK
Battery Remaining Longevity: 116 mo
Battery Remaining Percentage: 87 %
Battery Voltage: 3.02 V
Brady Statistic AP VP Percent: 1 %
Brady Statistic AP VS Percent: 8.7 %
Brady Statistic AS VP Percent: 1 %
Brady Statistic AS VS Percent: 89 %
Brady Statistic RA Percent Paced: 8.4 %
Brady Statistic RV Percent Paced: 1 %
Date Time Interrogation Session: 20230724020014
Implantable Lead Implant Date: 20210706
Implantable Lead Implant Date: 20210706
Implantable Lead Location: 753859
Implantable Lead Location: 753860
Implantable Pulse Generator Implant Date: 20210706
Lead Channel Impedance Value: 460 Ohm
Lead Channel Impedance Value: 590 Ohm
Lead Channel Pacing Threshold Amplitude: 0.5 V
Lead Channel Pacing Threshold Amplitude: 0.875 V
Lead Channel Pacing Threshold Pulse Width: 0.4 ms
Lead Channel Pacing Threshold Pulse Width: 0.4 ms
Lead Channel Sensing Intrinsic Amplitude: 1.2 mV
Lead Channel Sensing Intrinsic Amplitude: 3.7 mV
Lead Channel Setting Pacing Amplitude: 1.125
Lead Channel Setting Pacing Amplitude: 2 V
Lead Channel Setting Pacing Pulse Width: 0.4 ms
Lead Channel Setting Sensing Sensitivity: 2 mV
Pulse Gen Model: 2272
Pulse Gen Serial Number: 3847058

## 2022-07-20 NOTE — Progress Notes (Signed)
Remote pacemaker transmission.   

## 2022-09-07 ENCOUNTER — Ambulatory Visit: Payer: Medicare Other | Attending: Cardiology | Admitting: Cardiology

## 2022-09-07 ENCOUNTER — Encounter: Payer: Self-pay | Admitting: Cardiology

## 2022-09-07 VITALS — BP 117/72 | HR 76 | Ht 62.0 in | Wt 189.0 lb

## 2022-09-07 DIAGNOSIS — I459 Conduction disorder, unspecified: Secondary | ICD-10-CM | POA: Insufficient documentation

## 2022-09-07 DIAGNOSIS — I441 Atrioventricular block, second degree: Secondary | ICD-10-CM | POA: Insufficient documentation

## 2022-09-07 LAB — CUP PACEART INCLINIC DEVICE CHECK
Battery Remaining Longevity: 120 mo
Battery Voltage: 3.02 V
Brady Statistic RA Percent Paced: 8.2 %
Brady Statistic RV Percent Paced: 0.88 %
Date Time Interrogation Session: 20231016171806
Implantable Lead Implant Date: 20210706
Implantable Lead Implant Date: 20210706
Implantable Lead Location: 753859
Implantable Lead Location: 753860
Implantable Pulse Generator Implant Date: 20210706
Lead Channel Impedance Value: 462.5 Ohm
Lead Channel Impedance Value: 525 Ohm
Lead Channel Pacing Threshold Amplitude: 0.5 V
Lead Channel Pacing Threshold Amplitude: 0.5 V
Lead Channel Pacing Threshold Amplitude: 0.75 V
Lead Channel Pacing Threshold Amplitude: 0.75 V
Lead Channel Pacing Threshold Pulse Width: 0.4 ms
Lead Channel Pacing Threshold Pulse Width: 0.4 ms
Lead Channel Pacing Threshold Pulse Width: 0.4 ms
Lead Channel Pacing Threshold Pulse Width: 0.4 ms
Lead Channel Sensing Intrinsic Amplitude: 1.5 mV
Lead Channel Sensing Intrinsic Amplitude: 3.3 mV
Lead Channel Setting Pacing Amplitude: 1.125
Lead Channel Setting Pacing Amplitude: 2 V
Lead Channel Setting Pacing Pulse Width: 0.4 ms
Lead Channel Setting Sensing Sensitivity: 2 mV
Pulse Gen Model: 2272
Pulse Gen Serial Number: 3847058

## 2022-09-07 NOTE — Progress Notes (Signed)
Electrophysiology Office Note   Date:  09/07/2022   ID:  Jacqueline Fitzpatrick, DOB March 12, 1946, MRN [id number]  PCP:  Maryella Shivers, MD  Cardiologist:   Primary Electrophysiologist:  Branon Sabine Meredith Leeds, MD    Chief Complaint: pacemaker   History of Present Illness: Jacqueline Fitzpatrick is a 76 y.o. female who is being seen today for the evaluation of pacemaker at the request of Maryella Shivers, MD. Presenting today for electrophysiology evaluation.  She has a history significant for diabetes, hypertension, Mobitz 2 AV block.  She is status post [employer] dual-chamber pacemaker implanted in 2021.  Today, she denies symptoms of palpitations, chest pain, shortness of breath, orthopnea, PND, lower extremity edema, claudication, dizziness, presyncope, syncope, bleeding, or neurologic sequela. The patient is tolerating medications without difficulties.  Does state that she has heartburn when she lays down to sleep.  It is worse when she is on her right side.  Aside from that, she has no major complaints.   Past Medical History:  Diagnosis Date   Complication of anesthesia    hard to wake up   Diabetes mellitus without complication ([place])    Hypertension    Presence of permanent cardiac pacemaker    Second degree Mobitz II AV block    s/p PPM   Past Surgical History:  Procedure Laterality Date   CHOLECYSTECTOMY     PACEMAKER IMPLANT N/A 05/28/2020   Procedure: PACEMAKER IMPLANT;  Surgeon: Thompson Grayer, MD;  Location: [facility];  Service: Cardiovascular;  Laterality: N/A;   REPAIR EXTENSOR TENDON Right 12/12/2020   Procedure: RIGHT LONG FINGER EXTENSOR TENDON SUBLUXATION REPAIR;  Surgeon: Leanora Cover, MD;  Location: [facility];  Service: Orthopedics;  Laterality: Right;     Current Outpatient Medications  Medication Sig Dispense Refill   celecoxib (CELEBREX) 200 MG capsule Take 200 mg by mouth daily.     Dulaglutide (TRULICITY) A999333 0000000 SOPN Inject into  the skin once a week.     FLUoxetine (PROZAC) 20 MG capsule Take 20 mg by mouth daily.     glipiZIDE (GLUCOTROL) 10 MG tablet Take 10 mg by mouth 2 (two) times daily.     JARDIANCE 25 MG TABS tablet Take 25 mg by mouth daily.     lisinopril (ZESTRIL) 40 MG tablet Take 40 mg by mouth daily.     metoprolol succinate (TOPROL XL) 25 MG 24 hr tablet Take 0.5 tablets (12.5 mg total) by mouth at bedtime. 45 tablet 1   naproxen (NAPROSYN) 500 MG tablet Take 1 tablet by mouth daily.     PARoxetine (PAXIL) 40 MG tablet Take 40 mg by mouth daily.     rOPINIRole (REQUIP) 1 MG tablet Take 1 mg by mouth 2 (two) times daily.     rosuvastatin (CRESTOR) 10 MG tablet Take 10 mg by mouth daily.     traZODone (DESYREL) 50 MG tablet Take 50 mg by mouth at bedtime as needed for sleep.     nitroGLYCERIN (NITROSTAT) 0.4 MG SL tablet Place 1 tablet (0.4 mg total) under the tongue every 5 (five) minutes as needed for chest pain. 25 tablet 3   No current facility-administered medications for this visit.    Allergies:   Metformin   Social History:  The patient  reports that she has been smoking. She has never used smokeless tobacco. She reports that she does not currently use alcohol. She reports that she does not use drugs.   Family History:  The patient's family  history includes Aneurysm in her mother; Autism in her sister; Diabetes in her paternal grandmother; Gallbladder disease in her mother; Heart Problems in her paternal grandmother; Heart disease in her father and mother.    ROS:  Please see the history of present illness.   Otherwise, review of systems is positive for none.   All other systems are reviewed and negative.    PHYSICAL EXAM: VS:  BP 117/72   Pulse 76   Ht 5\' 2"  (1.575 m)   Wt 189 lb (85.7 kg)   SpO2 97%   BMI 34.57 kg/m  , BMI Body mass index is 34.57 kg/m. GEN: Well nourished, well developed, in no acute distress  HEENT: normal  Neck: no JVD, carotid bruits, or masses Cardiac: RRR;  no murmurs, rubs, or gallops,no edema  Respiratory:  clear to auscultation bilaterally, normal work of breathing GI: soft, nontender, nondistended, + BS MS: no deformity or atrophy  Skin: warm and dry, device pocket is well healed Neuro:  Strength and sensation are intact Psych: euthymic mood, full affect  EKG:  EKG is ordered today. Personal review of the ekg ordered shows sinus rhythm, left axis deviation, rate 76  Device interrogation is reviewed today in detail.  See PaceArt for details.   Recent Labs: No results found for requested labs within last 365 days.    Lipid Panel     Component Value Date/Time   CHOL 121 08/13/2021 1047   TRIG 98 08/13/2021 1047   HDL 56 08/13/2021 1047   CHOLHDL 2.2 08/13/2021 1047   LDLCALC 47 08/13/2021 1047     Wt Readings from Last 3 Encounters:  09/07/22 189 lb (85.7 kg)  12/23/21 196 lb 9.6 oz (89.2 kg)  06/20/21 192 lb 6.4 oz (87.3 kg)      Other studies Reviewed: Additional studies/ records that were reviewed today include: TTE 06/09/21  Review of the above records today demonstrates:   1. Left ventricular ejection fraction, by estimation, is 55 to 60%. The  left ventricle has normal function. The left ventricle has no regional  wall motion abnormalities. Left ventricular diastolic parameters are  consistent with Grade I diastolic  dysfunction (impaired relaxation).   2. Right ventricular systolic function is normal. The right ventricular  size is normal. There is normal pulmonary artery systolic pressure.   3. The mitral valve is normal in structure. Trivial mitral valve  regurgitation. No evidence of mitral stenosis.   4. The aortic valve is normal in structure. Aortic valve regurgitation is  not visualized. No aortic stenosis is present.   5. The inferior vena cava is normal in size with greater than 50%  respiratory variability, suggesting right atrial pressure of 3 mmHg.   Myoview 06/10/2021 The left ventricular ejection  fraction is hyperdynamic (>65%). Nuclear stress EF: 73%. There was no ST segment deviation noted during stress. Defect 1: There is a medium defect of mild severity present in the mid anteroseptal and apical septal location. Findings consistent with ischemia. This is a low risk study.   ASSESSMENT AND PLAN:  1.  Second-degree Mobitz 2 AV block: Status post [employer] dual-chamber pacemaker implanted in 2021.  Device functioning appropriately.  No changes at this time.  2.  Hypertension: Currently well controlled   Current medicines are reviewed at length with the patient today.   The patient does not have concerns regarding her medicines.  The following changes were made today:  none  Labs/ tests ordered today include:  Orders Placed  This Encounter  Procedures   EKG 12-Lead     Disposition:   FU with Jwan Hornbaker 1 year  Signed, Sarya Linenberger Meredith Leeds, MD  09/07/2022 4:17 PM     [address]  [place] [address] [phone] (office) [phone] (fax)

## 2022-09-14 ENCOUNTER — Ambulatory Visit: Payer: Medicare Other | Attending: Cardiology

## 2022-09-14 DIAGNOSIS — I459 Conduction disorder, unspecified: Secondary | ICD-10-CM | POA: Insufficient documentation

## 2022-09-14 DIAGNOSIS — Z950 Presence of cardiac pacemaker: Secondary | ICD-10-CM | POA: Insufficient documentation

## 2022-09-15 LAB — CUP PACEART REMOTE DEVICE CHECK
Battery Remaining Longevity: 112 mo
Battery Remaining Percentage: 84 %
Battery Voltage: 3.02 V
Brady Statistic AP VP Percent: 1 %
Brady Statistic AP VS Percent: 14 %
Brady Statistic AS VP Percent: 1 %
Brady Statistic AS VS Percent: 85 %
Brady Statistic RA Percent Paced: 14 %
Brady Statistic RV Percent Paced: 1 %
Date Time Interrogation Session: 20231023020013
Implantable Lead Connection Status: 753985
Implantable Lead Connection Status: 753985
Implantable Lead Implant Date: 20210706
Implantable Lead Implant Date: 20210706
Implantable Lead Location: 753859
Implantable Lead Location: 753860
Implantable Pulse Generator Implant Date: 20210706
Lead Channel Impedance Value: 440 Ohm
Lead Channel Impedance Value: 540 Ohm
Lead Channel Pacing Threshold Amplitude: 0.5 V
Lead Channel Pacing Threshold Amplitude: 0.75 V
Lead Channel Pacing Threshold Pulse Width: 0.4 ms
Lead Channel Pacing Threshold Pulse Width: 0.4 ms
Lead Channel Sensing Intrinsic Amplitude: 2.7 mV
Lead Channel Sensing Intrinsic Amplitude: 3.6 mV
Lead Channel Setting Pacing Amplitude: 1 V
Lead Channel Setting Pacing Amplitude: 2 V
Lead Channel Setting Pacing Pulse Width: 0.4 ms
Lead Channel Setting Sensing Sensitivity: 2 mV
Pulse Gen Model: 2272
Pulse Gen Serial Number: 3847058

## 2022-09-16 ENCOUNTER — Other Ambulatory Visit: Payer: Self-pay | Admitting: Student

## 2022-09-23 DIAGNOSIS — E785 Hyperlipidemia, unspecified: Secondary | ICD-10-CM | POA: Diagnosis not present

## 2022-09-23 DIAGNOSIS — Z1389 Encounter for screening for other disorder: Secondary | ICD-10-CM | POA: Diagnosis not present

## 2022-09-23 DIAGNOSIS — Z0000 Encounter for general adult medical examination without abnormal findings: Secondary | ICD-10-CM | POA: Diagnosis not present

## 2022-09-23 DIAGNOSIS — Z1339 Encounter for screening examination for other mental health and behavioral disorders: Secondary | ICD-10-CM | POA: Diagnosis not present

## 2022-09-23 DIAGNOSIS — E1151 Type 2 diabetes mellitus with diabetic peripheral angiopathy without gangrene: Secondary | ICD-10-CM | POA: Diagnosis not present

## 2022-09-23 DIAGNOSIS — Z1331 Encounter for screening for depression: Secondary | ICD-10-CM | POA: Diagnosis not present

## 2022-09-23 DIAGNOSIS — Z139 Encounter for screening, unspecified: Secondary | ICD-10-CM | POA: Diagnosis not present

## 2022-09-23 DIAGNOSIS — E1159 Type 2 diabetes mellitus with other circulatory complications: Secondary | ICD-10-CM | POA: Diagnosis not present

## 2022-09-23 DIAGNOSIS — Z136 Encounter for screening for cardiovascular disorders: Secondary | ICD-10-CM | POA: Diagnosis not present

## 2022-10-12 NOTE — Progress Notes (Signed)
Remote pacemaker transmission.   

## 2022-10-23 DIAGNOSIS — E1151 Type 2 diabetes mellitus with diabetic peripheral angiopathy without gangrene: Secondary | ICD-10-CM | POA: Diagnosis not present

## 2022-10-23 DIAGNOSIS — Z6833 Body mass index (BMI) 33.0-33.9, adult: Secondary | ICD-10-CM | POA: Diagnosis not present

## 2022-10-23 DIAGNOSIS — Z1331 Encounter for screening for depression: Secondary | ICD-10-CM | POA: Diagnosis not present

## 2022-10-23 DIAGNOSIS — E785 Hyperlipidemia, unspecified: Secondary | ICD-10-CM | POA: Diagnosis not present

## 2022-10-23 DIAGNOSIS — Z139 Encounter for screening, unspecified: Secondary | ICD-10-CM | POA: Diagnosis not present

## 2022-10-23 DIAGNOSIS — Z23 Encounter for immunization: Secondary | ICD-10-CM | POA: Diagnosis not present

## 2022-10-23 DIAGNOSIS — E1159 Type 2 diabetes mellitus with other circulatory complications: Secondary | ICD-10-CM | POA: Diagnosis not present

## 2022-10-23 DIAGNOSIS — I152 Hypertension secondary to endocrine disorders: Secondary | ICD-10-CM | POA: Diagnosis not present

## 2022-12-14 ENCOUNTER — Ambulatory Visit: Payer: Medicare Other | Attending: Cardiology

## 2022-12-14 DIAGNOSIS — I441 Atrioventricular block, second degree: Secondary | ICD-10-CM | POA: Diagnosis not present

## 2022-12-15 LAB — CUP PACEART REMOTE DEVICE CHECK
Battery Remaining Longevity: 109 mo
Battery Remaining Percentage: 82 %
Battery Voltage: 3.02 V
Brady Statistic AP VP Percent: 1 %
Brady Statistic AP VS Percent: 12 %
Brady Statistic AS VP Percent: 1 %
Brady Statistic AS VS Percent: 86 %
Brady Statistic RA Percent Paced: 12 %
Brady Statistic RV Percent Paced: 1 %
Date Time Interrogation Session: 20240122032448
Implantable Lead Connection Status: 753985
Implantable Lead Connection Status: 753985
Implantable Lead Implant Date: 20210706
Implantable Lead Implant Date: 20210706
Implantable Lead Location: 753859
Implantable Lead Location: 753860
Implantable Pulse Generator Implant Date: 20210706
Lead Channel Impedance Value: 430 Ohm
Lead Channel Impedance Value: 540 Ohm
Lead Channel Pacing Threshold Amplitude: 0.5 V
Lead Channel Pacing Threshold Amplitude: 0.875 V
Lead Channel Pacing Threshold Pulse Width: 0.4 ms
Lead Channel Pacing Threshold Pulse Width: 0.4 ms
Lead Channel Sensing Intrinsic Amplitude: 0.8 mV
Lead Channel Sensing Intrinsic Amplitude: 3.8 mV
Lead Channel Setting Pacing Amplitude: 1.125
Lead Channel Setting Pacing Amplitude: 2 V
Lead Channel Setting Pacing Pulse Width: 0.4 ms
Lead Channel Setting Sensing Sensitivity: 2 mV
Pulse Gen Model: 2272
Pulse Gen Serial Number: 3847058

## 2022-12-24 ENCOUNTER — Other Ambulatory Visit: Payer: Self-pay | Admitting: Student

## 2023-01-29 DIAGNOSIS — E1159 Type 2 diabetes mellitus with other circulatory complications: Secondary | ICD-10-CM | POA: Diagnosis not present

## 2023-01-29 DIAGNOSIS — E1151 Type 2 diabetes mellitus with diabetic peripheral angiopathy without gangrene: Secondary | ICD-10-CM | POA: Diagnosis not present

## 2023-01-29 DIAGNOSIS — E785 Hyperlipidemia, unspecified: Secondary | ICD-10-CM | POA: Diagnosis not present

## 2023-02-01 NOTE — Progress Notes (Signed)
Remote pacemaker transmission.   

## 2023-02-19 DIAGNOSIS — E785 Hyperlipidemia, unspecified: Secondary | ICD-10-CM | POA: Diagnosis not present

## 2023-02-19 DIAGNOSIS — I152 Hypertension secondary to endocrine disorders: Secondary | ICD-10-CM | POA: Diagnosis not present

## 2023-02-19 DIAGNOSIS — F17210 Nicotine dependence, cigarettes, uncomplicated: Secondary | ICD-10-CM | POA: Diagnosis not present

## 2023-02-19 DIAGNOSIS — Z6833 Body mass index (BMI) 33.0-33.9, adult: Secondary | ICD-10-CM | POA: Diagnosis not present

## 2023-02-19 DIAGNOSIS — E1151 Type 2 diabetes mellitus with diabetic peripheral angiopathy without gangrene: Secondary | ICD-10-CM | POA: Diagnosis not present

## 2023-02-19 DIAGNOSIS — E1159 Type 2 diabetes mellitus with other circulatory complications: Secondary | ICD-10-CM | POA: Diagnosis not present

## 2023-02-19 DIAGNOSIS — N182 Chronic kidney disease, stage 2 (mild): Secondary | ICD-10-CM | POA: Diagnosis not present

## 2023-03-15 ENCOUNTER — Ambulatory Visit (INDEPENDENT_AMBULATORY_CARE_PROVIDER_SITE_OTHER): Payer: Medicare Other

## 2023-03-15 DIAGNOSIS — I441 Atrioventricular block, second degree: Secondary | ICD-10-CM | POA: Diagnosis not present

## 2023-03-16 LAB — CUP PACEART REMOTE DEVICE CHECK
Battery Remaining Longevity: 105 mo
Battery Remaining Percentage: 80 %
Battery Voltage: 3.02 V
Brady Statistic AP VP Percent: 1 %
Brady Statistic AP VS Percent: 10 %
Brady Statistic AS VP Percent: 1 %
Brady Statistic AS VS Percent: 87 %
Brady Statistic RA Percent Paced: 10 %
Brady Statistic RV Percent Paced: 1 %
Date Time Interrogation Session: 20240422020012
Implantable Lead Connection Status: 753985
Implantable Lead Connection Status: 753985
Implantable Lead Implant Date: 20210706
Implantable Lead Implant Date: 20210706
Implantable Lead Location: 753859
Implantable Lead Location: 753860
Implantable Pulse Generator Implant Date: 20210706
Lead Channel Impedance Value: 440 Ohm
Lead Channel Impedance Value: 490 Ohm
Lead Channel Pacing Threshold Amplitude: 0.5 V
Lead Channel Pacing Threshold Amplitude: 0.875 V
Lead Channel Pacing Threshold Pulse Width: 0.4 ms
Lead Channel Pacing Threshold Pulse Width: 0.4 ms
Lead Channel Sensing Intrinsic Amplitude: 1.4 mV
Lead Channel Sensing Intrinsic Amplitude: 3.8 mV
Lead Channel Setting Pacing Amplitude: 1.125
Lead Channel Setting Pacing Amplitude: 2 V
Lead Channel Setting Pacing Pulse Width: 0.4 ms
Lead Channel Setting Sensing Sensitivity: 2 mV
Pulse Gen Model: 2272
Pulse Gen Serial Number: 3847058

## 2023-04-21 NOTE — Progress Notes (Signed)
Remote pacemaker transmission.   

## 2023-05-05 DIAGNOSIS — Z6833 Body mass index (BMI) 33.0-33.9, adult: Secondary | ICD-10-CM | POA: Diagnosis not present

## 2023-05-05 DIAGNOSIS — Z789 Other specified health status: Secondary | ICD-10-CM | POA: Diagnosis not present

## 2023-05-05 DIAGNOSIS — F32A Depression, unspecified: Secondary | ICD-10-CM | POA: Diagnosis not present

## 2023-05-05 DIAGNOSIS — Z1331 Encounter for screening for depression: Secondary | ICD-10-CM | POA: Diagnosis not present

## 2023-05-05 DIAGNOSIS — F419 Anxiety disorder, unspecified: Secondary | ICD-10-CM | POA: Diagnosis not present

## 2023-05-14 DIAGNOSIS — E1159 Type 2 diabetes mellitus with other circulatory complications: Secondary | ICD-10-CM | POA: Diagnosis not present

## 2023-05-14 DIAGNOSIS — E1151 Type 2 diabetes mellitus with diabetic peripheral angiopathy without gangrene: Secondary | ICD-10-CM | POA: Diagnosis not present

## 2023-05-14 DIAGNOSIS — E785 Hyperlipidemia, unspecified: Secondary | ICD-10-CM | POA: Diagnosis not present

## 2023-05-24 DIAGNOSIS — E1151 Type 2 diabetes mellitus with diabetic peripheral angiopathy without gangrene: Secondary | ICD-10-CM | POA: Diagnosis not present

## 2023-05-24 DIAGNOSIS — I10 Essential (primary) hypertension: Secondary | ICD-10-CM | POA: Diagnosis not present

## 2023-05-24 DIAGNOSIS — E785 Hyperlipidemia, unspecified: Secondary | ICD-10-CM | POA: Diagnosis not present

## 2023-05-24 DIAGNOSIS — Z6833 Body mass index (BMI) 33.0-33.9, adult: Secondary | ICD-10-CM | POA: Diagnosis not present

## 2023-06-09 DIAGNOSIS — Z6832 Body mass index (BMI) 32.0-32.9, adult: Secondary | ICD-10-CM | POA: Diagnosis not present

## 2023-06-09 DIAGNOSIS — M545 Low back pain, unspecified: Secondary | ICD-10-CM | POA: Diagnosis not present

## 2023-06-14 ENCOUNTER — Ambulatory Visit: Payer: Medicare Other

## 2023-06-14 DIAGNOSIS — I441 Atrioventricular block, second degree: Secondary | ICD-10-CM

## 2023-06-16 LAB — CUP PACEART REMOTE DEVICE CHECK
Battery Remaining Longevity: 104 mo
Battery Remaining Percentage: 78 %
Battery Voltage: 3.02 V
Brady Statistic AP VP Percent: 1 %
Brady Statistic AP VS Percent: 15 %
Brady Statistic AS VP Percent: 1 %
Brady Statistic AS VS Percent: 82 %
Brady Statistic RA Percent Paced: 15 %
Brady Statistic RV Percent Paced: 1.3 %
Date Time Interrogation Session: 20240722020028
Implantable Lead Connection Status: 753985
Implantable Lead Connection Status: 753985
Implantable Lead Implant Date: 20210706
Implantable Lead Implant Date: 20210706
Implantable Lead Location: 753859
Implantable Lead Location: 753860
Implantable Pulse Generator Implant Date: 20210706
Lead Channel Impedance Value: 460 Ohm
Lead Channel Impedance Value: 560 Ohm
Lead Channel Pacing Threshold Amplitude: 0.5 V
Lead Channel Pacing Threshold Amplitude: 0.875 V
Lead Channel Pacing Threshold Pulse Width: 0.4 ms
Lead Channel Pacing Threshold Pulse Width: 0.4 ms
Lead Channel Sensing Intrinsic Amplitude: 0.8 mV
Lead Channel Sensing Intrinsic Amplitude: 3.6 mV
Lead Channel Setting Pacing Amplitude: 1.125
Lead Channel Setting Pacing Amplitude: 2 V
Lead Channel Setting Pacing Pulse Width: 0.4 ms
Lead Channel Setting Sensing Sensitivity: 2 mV
Pulse Gen Model: 2272
Pulse Gen Serial Number: 3847058

## 2023-06-18 DIAGNOSIS — M4316 Spondylolisthesis, lumbar region: Secondary | ICD-10-CM | POA: Diagnosis not present

## 2023-06-18 DIAGNOSIS — M47816 Spondylosis without myelopathy or radiculopathy, lumbar region: Secondary | ICD-10-CM | POA: Diagnosis not present

## 2023-06-18 DIAGNOSIS — M545 Low back pain, unspecified: Secondary | ICD-10-CM | POA: Diagnosis not present

## 2023-06-18 DIAGNOSIS — M439 Deforming dorsopathy, unspecified: Secondary | ICD-10-CM | POA: Diagnosis not present

## 2023-07-02 NOTE — Progress Notes (Signed)
Remote pacemaker transmission.   

## 2023-07-06 DIAGNOSIS — H40003 Preglaucoma, unspecified, bilateral: Secondary | ICD-10-CM | POA: Diagnosis not present

## 2023-07-06 DIAGNOSIS — Z961 Presence of intraocular lens: Secondary | ICD-10-CM | POA: Diagnosis not present

## 2023-07-06 DIAGNOSIS — E119 Type 2 diabetes mellitus without complications: Secondary | ICD-10-CM | POA: Diagnosis not present

## 2023-07-06 DIAGNOSIS — H47233 Glaucomatous optic atrophy, bilateral: Secondary | ICD-10-CM | POA: Diagnosis not present

## 2023-07-06 DIAGNOSIS — H5203 Hypermetropia, bilateral: Secondary | ICD-10-CM | POA: Diagnosis not present

## 2023-07-06 DIAGNOSIS — H40013 Open angle with borderline findings, low risk, bilateral: Secondary | ICD-10-CM | POA: Diagnosis not present

## 2023-07-06 DIAGNOSIS — H524 Presbyopia: Secondary | ICD-10-CM | POA: Diagnosis not present

## 2023-07-06 DIAGNOSIS — Z9849 Cataract extraction status, unspecified eye: Secondary | ICD-10-CM | POA: Diagnosis not present

## 2023-07-06 DIAGNOSIS — Z794 Long term (current) use of insulin: Secondary | ICD-10-CM | POA: Diagnosis not present

## 2023-07-06 DIAGNOSIS — H52223 Regular astigmatism, bilateral: Secondary | ICD-10-CM | POA: Diagnosis not present

## 2023-09-13 ENCOUNTER — Ambulatory Visit: Payer: Medicare Other

## 2023-09-13 DIAGNOSIS — I441 Atrioventricular block, second degree: Secondary | ICD-10-CM | POA: Diagnosis not present

## 2023-09-15 LAB — CUP PACEART REMOTE DEVICE CHECK
Battery Remaining Longevity: 100 mo
Battery Remaining Percentage: 76 %
Battery Voltage: 3.02 V
Brady Statistic AP VP Percent: 1 %
Brady Statistic AP VS Percent: 13 %
Brady Statistic AS VP Percent: 1 %
Brady Statistic AS VS Percent: 84 %
Brady Statistic RA Percent Paced: 13 %
Brady Statistic RV Percent Paced: 1.3 %
Date Time Interrogation Session: 20241021022842
Implantable Lead Connection Status: 753985
Implantable Lead Connection Status: 753985
Implantable Lead Implant Date: 20210706
Implantable Lead Implant Date: 20210706
Implantable Lead Location: 753859
Implantable Lead Location: 753860
Implantable Pulse Generator Implant Date: 20210706
Lead Channel Impedance Value: 410 Ohm
Lead Channel Impedance Value: 530 Ohm
Lead Channel Pacing Threshold Amplitude: 0.5 V
Lead Channel Pacing Threshold Amplitude: 0.75 V
Lead Channel Pacing Threshold Pulse Width: 0.4 ms
Lead Channel Pacing Threshold Pulse Width: 0.4 ms
Lead Channel Sensing Intrinsic Amplitude: 1.1 mV
Lead Channel Sensing Intrinsic Amplitude: 3.3 mV
Lead Channel Setting Pacing Amplitude: 1 V
Lead Channel Setting Pacing Amplitude: 2 V
Lead Channel Setting Pacing Pulse Width: 0.4 ms
Lead Channel Setting Sensing Sensitivity: 2 mV
Pulse Gen Model: 2272
Pulse Gen Serial Number: 3847058

## 2023-09-17 ENCOUNTER — Telehealth: Payer: Self-pay | Admitting: Cardiology

## 2023-09-17 DIAGNOSIS — E1159 Type 2 diabetes mellitus with other circulatory complications: Secondary | ICD-10-CM | POA: Diagnosis not present

## 2023-09-17 DIAGNOSIS — E785 Hyperlipidemia, unspecified: Secondary | ICD-10-CM | POA: Diagnosis not present

## 2023-09-17 DIAGNOSIS — E1151 Type 2 diabetes mellitus with diabetic peripheral angiopathy without gangrene: Secondary | ICD-10-CM | POA: Diagnosis not present

## 2023-09-17 MED ORDER — METOPROLOL SUCCINATE ER 25 MG PO TB24: 25.0000 mg | ORAL_TABLET | Freq: Every day | ORAL | 0 refills | Status: AC

## 2023-09-17 NOTE — Telephone Encounter (Signed)
*  STAT* If patient is at the pharmacy, call can be transferred to refill team.   1. Which medications need to be refilled? (please list name of each medication and dose if known) metoprolol succinate (TOPROL-XL) 25 MG 24 hr tablet    2. Would you like to learn more about the convenience, safety, & potential cost savings by using the [facility]?    3. Are you open to using the [facility] (Type [facility].  ).   4. Which pharmacy/location (including street and city if local pharmacy) is medication to be sent to?  [id number] - [place], [place] - [address] AT [address]    5. Do they need a 30 day or 90 day supply?  90 day  Patient is out of medication.

## 2023-09-17 NOTE — Telephone Encounter (Signed)
Pt's medication was sent to pt's pharmacy as requested. Confirmation received.  °

## 2023-09-24 DIAGNOSIS — E785 Hyperlipidemia, unspecified: Secondary | ICD-10-CM | POA: Diagnosis not present

## 2023-09-24 DIAGNOSIS — I10 Essential (primary) hypertension: Secondary | ICD-10-CM | POA: Diagnosis not present

## 2023-09-27 DIAGNOSIS — Z0000 Encounter for general adult medical examination without abnormal findings: Secondary | ICD-10-CM | POA: Diagnosis not present

## 2023-09-27 DIAGNOSIS — Z6835 Body mass index (BMI) 35.0-35.9, adult: Secondary | ICD-10-CM | POA: Diagnosis not present

## 2023-09-27 DIAGNOSIS — Z1389 Encounter for screening for other disorder: Secondary | ICD-10-CM | POA: Diagnosis not present

## 2023-09-27 DIAGNOSIS — E1159 Type 2 diabetes mellitus with other circulatory complications: Secondary | ICD-10-CM | POA: Diagnosis not present

## 2023-09-27 DIAGNOSIS — Z136 Encounter for screening for cardiovascular disorders: Secondary | ICD-10-CM | POA: Diagnosis not present

## 2023-09-27 DIAGNOSIS — Z23 Encounter for immunization: Secondary | ICD-10-CM | POA: Diagnosis not present

## 2023-09-27 DIAGNOSIS — I152 Hypertension secondary to endocrine disorders: Secondary | ICD-10-CM | POA: Diagnosis not present

## 2023-09-27 DIAGNOSIS — Z1339 Encounter for screening examination for other mental health and behavioral disorders: Secondary | ICD-10-CM | POA: Diagnosis not present

## 2023-09-27 DIAGNOSIS — E1151 Type 2 diabetes mellitus with diabetic peripheral angiopathy without gangrene: Secondary | ICD-10-CM | POA: Diagnosis not present

## 2023-09-27 DIAGNOSIS — Z139 Encounter for screening, unspecified: Secondary | ICD-10-CM | POA: Diagnosis not present

## 2023-09-27 DIAGNOSIS — I209 Angina pectoris, unspecified: Secondary | ICD-10-CM | POA: Diagnosis not present

## 2023-09-27 DIAGNOSIS — Z1331 Encounter for screening for depression: Secondary | ICD-10-CM | POA: Diagnosis not present

## 2023-09-30 NOTE — Progress Notes (Signed)
Remote pacemaker transmission.   

## 2023-10-06 DIAGNOSIS — H40013 Open angle with borderline findings, low risk, bilateral: Secondary | ICD-10-CM | POA: Diagnosis not present

## 2023-10-06 DIAGNOSIS — H47233 Glaucomatous optic atrophy, bilateral: Secondary | ICD-10-CM | POA: Diagnosis not present

## 2023-11-24 DIAGNOSIS — E785 Hyperlipidemia, unspecified: Secondary | ICD-10-CM | POA: Diagnosis not present

## 2023-11-24 DIAGNOSIS — I10 Essential (primary) hypertension: Secondary | ICD-10-CM | POA: Diagnosis not present

## 2023-12-13 ENCOUNTER — Ambulatory Visit (INDEPENDENT_AMBULATORY_CARE_PROVIDER_SITE_OTHER): Payer: Medicare Other

## 2023-12-13 DIAGNOSIS — I441 Atrioventricular block, second degree: Secondary | ICD-10-CM

## 2023-12-13 LAB — CUP PACEART REMOTE DEVICE CHECK
Battery Remaining Longevity: 97 mo
Battery Remaining Percentage: 74 %
Battery Voltage: 3.02 V
Brady Statistic AP VP Percent: 1 %
Brady Statistic AP VS Percent: 14 %
Brady Statistic AS VP Percent: 1 %
Brady Statistic AS VS Percent: 84 %
Brady Statistic RA Percent Paced: 13 %
Brady Statistic RV Percent Paced: 1.3 %
Date Time Interrogation Session: 20250120020014
Implantable Lead Connection Status: 753985
Implantable Lead Connection Status: 753985
Implantable Lead Implant Date: 20210706
Implantable Lead Implant Date: 20210706
Implantable Lead Location: 753859
Implantable Lead Location: 753860
Implantable Pulse Generator Implant Date: 20210706
Lead Channel Impedance Value: 440 Ohm
Lead Channel Impedance Value: 530 Ohm
Lead Channel Pacing Threshold Amplitude: 0.5 V
Lead Channel Pacing Threshold Amplitude: 1 V
Lead Channel Pacing Threshold Pulse Width: 0.4 ms
Lead Channel Pacing Threshold Pulse Width: 0.4 ms
Lead Channel Sensing Intrinsic Amplitude: 1.6 mV
Lead Channel Sensing Intrinsic Amplitude: 4.1 mV
Lead Channel Setting Pacing Amplitude: 1.25 V
Lead Channel Setting Pacing Amplitude: 2 V
Lead Channel Setting Pacing Pulse Width: 0.4 ms
Lead Channel Setting Sensing Sensitivity: 2 mV
Pulse Gen Model: 2272
Pulse Gen Serial Number: 3847058

## 2024-01-20 NOTE — Progress Notes (Signed)
 Remote pacemaker transmission.

## 2024-02-03 DIAGNOSIS — E1159 Type 2 diabetes mellitus with other circulatory complications: Secondary | ICD-10-CM | POA: Diagnosis not present

## 2024-02-03 DIAGNOSIS — E785 Hyperlipidemia, unspecified: Secondary | ICD-10-CM | POA: Diagnosis not present

## 2024-02-03 DIAGNOSIS — E1151 Type 2 diabetes mellitus with diabetic peripheral angiopathy without gangrene: Secondary | ICD-10-CM | POA: Diagnosis not present

## 2024-02-22 DIAGNOSIS — E1151 Type 2 diabetes mellitus with diabetic peripheral angiopathy without gangrene: Secondary | ICD-10-CM | POA: Diagnosis not present

## 2024-02-22 DIAGNOSIS — N182 Chronic kidney disease, stage 2 (mild): Secondary | ICD-10-CM | POA: Diagnosis not present

## 2024-03-13 ENCOUNTER — Ambulatory Visit (INDEPENDENT_AMBULATORY_CARE_PROVIDER_SITE_OTHER): Payer: Medicare Other

## 2024-03-13 DIAGNOSIS — I441 Atrioventricular block, second degree: Secondary | ICD-10-CM

## 2024-03-14 LAB — CUP PACEART REMOTE DEVICE CHECK
Battery Remaining Longevity: 95 mo
Battery Remaining Percentage: 72 %
Battery Voltage: 3.02 V
Brady Statistic AP VP Percent: 1 %
Brady Statistic AP VS Percent: 12 %
Brady Statistic AS VP Percent: 1 %
Brady Statistic AS VS Percent: 86 %
Brady Statistic RA Percent Paced: 12 %
Brady Statistic RV Percent Paced: 1.3 %
Date Time Interrogation Session: 20250421020025
Implantable Lead Connection Status: 753985
Implantable Lead Connection Status: 753985
Implantable Lead Implant Date: 20210706
Implantable Lead Implant Date: 20210706
Implantable Lead Location: 753859
Implantable Lead Location: 753860
Implantable Pulse Generator Implant Date: 20210706
Lead Channel Impedance Value: 410 Ohm
Lead Channel Impedance Value: 530 Ohm
Lead Channel Pacing Threshold Amplitude: 0.5 V
Lead Channel Pacing Threshold Amplitude: 0.875 V
Lead Channel Pacing Threshold Pulse Width: 0.4 ms
Lead Channel Pacing Threshold Pulse Width: 0.4 ms
Lead Channel Sensing Intrinsic Amplitude: 3.3 mV
Lead Channel Sensing Intrinsic Amplitude: 3.9 mV
Lead Channel Setting Pacing Amplitude: 1.125
Lead Channel Setting Pacing Amplitude: 2 V
Lead Channel Setting Pacing Pulse Width: 0.4 ms
Lead Channel Setting Sensing Sensitivity: 2 mV
Pulse Gen Model: 2272
Pulse Gen Serial Number: 3847058

## 2024-04-07 DIAGNOSIS — H40013 Open angle with borderline findings, low risk, bilateral: Secondary | ICD-10-CM | POA: Diagnosis not present

## 2024-04-07 DIAGNOSIS — H47233 Glaucomatous optic atrophy, bilateral: Secondary | ICD-10-CM | POA: Diagnosis not present

## 2024-04-19 DIAGNOSIS — Z6830 Body mass index (BMI) 30.0-30.9, adult: Secondary | ICD-10-CM | POA: Diagnosis not present

## 2024-04-19 DIAGNOSIS — Z9181 History of falling: Secondary | ICD-10-CM | POA: Diagnosis not present

## 2024-04-19 DIAGNOSIS — E1159 Type 2 diabetes mellitus with other circulatory complications: Secondary | ICD-10-CM | POA: Diagnosis not present

## 2024-04-19 DIAGNOSIS — I152 Hypertension secondary to endocrine disorders: Secondary | ICD-10-CM | POA: Diagnosis not present

## 2024-04-19 DIAGNOSIS — E1151 Type 2 diabetes mellitus with diabetic peripheral angiopathy without gangrene: Secondary | ICD-10-CM | POA: Diagnosis not present

## 2024-04-19 DIAGNOSIS — E785 Hyperlipidemia, unspecified: Secondary | ICD-10-CM | POA: Diagnosis not present

## 2024-04-23 NOTE — Progress Notes (Unsigned)
  Electrophysiology Office Note:   Date:  04/24/2024  ID:  Jacqueline Fitzpatrick, DOB [birth year], MRN [id number]  Primary Cardiologist: None Primary Heart Failure: None Electrophysiologist: Adrionna Delcid Cortland Ding, MD      History of Present Illness:   Jacqueline Fitzpatrick is a 78 y.o. female with h/o diabetes, hypertension, Mobitz 2 AV block seen today for routine electrophysiology followup.   Since last being seen in our clinic the patient reports doing well.  He has no chest pain or shortness of breath.  Is able to all her daily activity without restriction.  She has no acute complaints at this time..  she denies chest pain, palpitations, dyspnea, PND, orthopnea, nausea, vomiting, dizziness, syncope, edema, weight gain, or early satiety.   Review of systems complete and found to be negative unless listed in HPI.      EP Information / Studies Reviewed:    EKG is ordered today. Personal review as below.  EKG Interpretation Date/Time:  Monday April 24 2024 10:46:50 EDT Ventricular Rate:  74 PR Interval:  150 QRS Duration:  70 QT Interval:  408 QTC Calculation: 452 R Axis:   -34  Text Interpretation: Normal sinus rhythm Left axis deviation When compared with ECG of 28-May-2020 00:02, No significant change since last tracing Confirmed by Marylen Zuk ([id number]) on 04/24/2024 10:58:07 AM   PPM Interrogation-  reviewed in detail today,  See PACEART report.  Device History: Abbott Dual Chamber PPM implanted 2021 for Second Degree AV block  Risk Assessment/Calculations:           Physical Exam:   VS:  BP 120/72   Pulse 74   Ht 5\' 2"  (1.575 m)   Wt 166 lb 3.2 oz (75.4 kg)   SpO2 98%   BMI 30.40 kg/m    Wt Readings from Last 3 Encounters:  04/24/24 166 lb 3.2 oz (75.4 kg)  09/07/22 189 lb (85.7 kg)  12/23/21 196 lb 9.6 oz (89.2 kg)     GEN: Well nourished, well developed in no acute distress NECK: No JVD; No carotid bruits CARDIAC: Regular rate and rhythm, no murmurs, rubs,  gallops RESPIRATORY:  Clear to auscultation without rales, wheezing or rhonchi  ABDOMEN: Soft, non-tender, non-distended EXTREMITIES:  No edema; No deformity   ASSESSMENT AND PLAN:    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  Hypertension: Well-controlled  Disposition:   Follow up with Dr. Lawana Pray 24 months   Signed, Sankalp Ferrell Cortland Ding, MD

## 2024-04-24 ENCOUNTER — Ambulatory Visit: Attending: Cardiology | Admitting: Cardiology

## 2024-04-24 ENCOUNTER — Encounter: Payer: Self-pay | Admitting: Cardiology

## 2024-04-24 ENCOUNTER — Ambulatory Visit: Payer: Self-pay | Admitting: Cardiology

## 2024-04-24 VITALS — BP 120/72 | HR 74 | Ht 62.0 in | Wt 166.2 lb

## 2024-04-24 DIAGNOSIS — I441 Atrioventricular block, second degree: Secondary | ICD-10-CM | POA: Insufficient documentation

## 2024-04-24 DIAGNOSIS — I10 Essential (primary) hypertension: Secondary | ICD-10-CM | POA: Diagnosis not present

## 2024-04-24 LAB — CUP PACEART INCLINIC DEVICE CHECK
Battery Remaining Longevity: 99 mo
Battery Voltage: 3.02 V
Brady Statistic RA Percent Paced: 11 %
Brady Statistic RV Percent Paced: 1.2 %
Date Time Interrogation Session: 20250602115110
Implantable Lead Connection Status: 753985
Implantable Lead Connection Status: 753985
Implantable Lead Implant Date: 20210706
Implantable Lead Implant Date: 20210706
Implantable Lead Location: 753859
Implantable Lead Location: 753860
Implantable Pulse Generator Implant Date: 20210706
Lead Channel Impedance Value: 437.5 Ohm
Lead Channel Impedance Value: 475 Ohm
Lead Channel Pacing Threshold Amplitude: 0.5 V
Lead Channel Pacing Threshold Amplitude: 0.5 V
Lead Channel Pacing Threshold Amplitude: 0.75 V
Lead Channel Pacing Threshold Amplitude: 0.75 V
Lead Channel Pacing Threshold Pulse Width: 0.4 ms
Lead Channel Pacing Threshold Pulse Width: 0.4 ms
Lead Channel Pacing Threshold Pulse Width: 0.4 ms
Lead Channel Pacing Threshold Pulse Width: 0.4 ms
Lead Channel Sensing Intrinsic Amplitude: 1.7 mV
Lead Channel Sensing Intrinsic Amplitude: 3.3 mV
Lead Channel Setting Pacing Amplitude: 1.25 V
Lead Channel Setting Pacing Amplitude: 2 V
Lead Channel Setting Pacing Pulse Width: 0.4 ms
Lead Channel Setting Sensing Sensitivity: 1.5 mV
Pulse Gen Model: 2272
Pulse Gen Serial Number: 3847058

## 2024-04-24 NOTE — Patient Instructions (Signed)
 Medication Instructions:  Your physician recommends that you continue on your current medications as directed. Please refer to the Current Medication list given to you today.  *If you need a refill on your cardiac medications before your next appointment, please call your pharmacy*  Lab Work: None ordered   Testing/Procedures: None ordered  Follow-Up: At [facility], you and your health needs are our priority.  As part of our continuing mission to provide you with exceptional heart care, our providers are all part of one team.  This team includes your primary Cardiologist (physician) and Advanced Practice Providers or APPs (Physician Assistants and Nurse Practitioners) who all work together to provide you with the care you need, when you need it.  Your next appointment:   2 year(s)  Provider:   Agatha Horsfall, MD    We recommend signing up for the patient portal called "MyChart".  Sign up information is provided on this After Visit Summary.  MyChart is used to connect with patients for Virtual Visits (Telemedicine).  Patients are able to view lab/test results, encounter notes, upcoming appointments, etc.  Non-urgent messages can be sent to your provider as well.   To learn more about what you can do with MyChart, go to [email or web address].   Thank you for choosing [facility]!!   Reece Cane, RN [phone]

## 2024-05-02 NOTE — Progress Notes (Signed)
 Remote pacemaker transmission.

## 2024-05-02 NOTE — Addendum Note (Signed)
 Addended by: Edra Govern D on: 05/02/2024 03:10 PM   Modules accepted: Orders

## 2024-06-12 ENCOUNTER — Ambulatory Visit: Payer: Medicare Other

## 2024-06-12 DIAGNOSIS — I441 Atrioventricular block, second degree: Secondary | ICD-10-CM | POA: Diagnosis not present

## 2024-06-13 LAB — CUP PACEART REMOTE DEVICE CHECK
Battery Remaining Longevity: 92 mo
Battery Remaining Percentage: 70 %
Battery Voltage: 3.02 V
Brady Statistic AP VP Percent: 1 %
Brady Statistic AP VS Percent: 4.5 %
Brady Statistic AS VP Percent: 1 %
Brady Statistic AS VS Percent: 94 %
Brady Statistic RA Percent Paced: 4.6 %
Brady Statistic RV Percent Paced: 1.1 %
Date Time Interrogation Session: 20250721023920
Implantable Lead Connection Status: 753985
Implantable Lead Connection Status: 753985
Implantable Lead Implant Date: 20210706
Implantable Lead Implant Date: 20210706
Implantable Lead Location: 753859
Implantable Lead Location: 753860
Implantable Pulse Generator Implant Date: 20210706
Lead Channel Impedance Value: 430 Ohm
Lead Channel Impedance Value: 540 Ohm
Lead Channel Pacing Threshold Amplitude: 0.5 V
Lead Channel Pacing Threshold Amplitude: 0.875 V
Lead Channel Pacing Threshold Pulse Width: 0.4 ms
Lead Channel Pacing Threshold Pulse Width: 0.4 ms
Lead Channel Sensing Intrinsic Amplitude: 1.3 mV
Lead Channel Sensing Intrinsic Amplitude: 3.2 mV
Lead Channel Setting Pacing Amplitude: 1.125
Lead Channel Setting Pacing Amplitude: 2 V
Lead Channel Setting Pacing Pulse Width: 0.4 ms
Lead Channel Setting Sensing Sensitivity: 1.5 mV
Pulse Gen Model: 2272
Pulse Gen Serial Number: 3847058

## 2024-06-19 ENCOUNTER — Ambulatory Visit: Payer: Self-pay | Admitting: Cardiology

## 2024-08-28 NOTE — Progress Notes (Signed)
 Remote PPM Transmission

## 2024-08-30 DIAGNOSIS — E785 Hyperlipidemia, unspecified: Secondary | ICD-10-CM | POA: Diagnosis not present

## 2024-08-30 DIAGNOSIS — E1151 Type 2 diabetes mellitus with diabetic peripheral angiopathy without gangrene: Secondary | ICD-10-CM | POA: Diagnosis not present

## 2024-08-30 DIAGNOSIS — E1159 Type 2 diabetes mellitus with other circulatory complications: Secondary | ICD-10-CM | POA: Diagnosis not present

## 2024-09-06 DIAGNOSIS — Z23 Encounter for immunization: Secondary | ICD-10-CM | POA: Diagnosis not present

## 2024-09-11 ENCOUNTER — Ambulatory Visit: Payer: Medicare Other

## 2024-12-11 ENCOUNTER — Ambulatory Visit: Payer: Medicare Other

## 2025-03-12 ENCOUNTER — Ambulatory Visit: Payer: Medicare Other

## 2025-06-11 ENCOUNTER — Ambulatory Visit: Payer: Medicare Other

## 2025-09-10 ENCOUNTER — Ambulatory Visit: Payer: Medicare Other

## 2025-12-10 ENCOUNTER — Ambulatory Visit: Payer: Medicare Other
